# Patient Record
Sex: Male | Born: 1965 | Race: White | Hispanic: No | Marital: Married | Smoking: Never smoker
Health system: Southern US, Community
[De-identification: ages and names within clinical notes are randomized; demographics above are authoritative.]

## PROBLEM LIST (undated history)

## (undated) DIAGNOSIS — F419 Anxiety disorder, unspecified: Secondary | ICD-10-CM

## (undated) DIAGNOSIS — E785 Hyperlipidemia, unspecified: Secondary | ICD-10-CM

## (undated) DIAGNOSIS — F909 Attention-deficit hyperactivity disorder, unspecified type: Secondary | ICD-10-CM

## (undated) DIAGNOSIS — F329 Major depressive disorder, single episode, unspecified: Secondary | ICD-10-CM

## (undated) DIAGNOSIS — K219 Gastro-esophageal reflux disease without esophagitis: Secondary | ICD-10-CM

## (undated) DIAGNOSIS — F32A Depression, unspecified: Secondary | ICD-10-CM

## (undated) DIAGNOSIS — T7840XA Allergy, unspecified, initial encounter: Secondary | ICD-10-CM

## (undated) HISTORY — DX: Hyperlipidemia, unspecified: E78.5

## (undated) HISTORY — DX: Depression, unspecified: F32.A

## (undated) HISTORY — DX: Major depressive disorder, single episode, unspecified: F32.9

## (undated) HISTORY — DX: Attention-deficit hyperactivity disorder, unspecified type: F90.9

## (undated) HISTORY — DX: Gastro-esophageal reflux disease without esophagitis: K21.9

## (undated) HISTORY — PX: NASAL SINUS SURGERY: SHX719

## (undated) HISTORY — DX: Anxiety disorder, unspecified: F41.9

## (undated) HISTORY — DX: Allergy, unspecified, initial encounter: T78.40XA

---

## 1991-11-06 HISTORY — PX: CHOLESTEATOMA EXCISION: SHX1345

## 1997-11-05 HISTORY — PX: SPINE SURGERY: SHX786

## 2004-10-20 ENCOUNTER — Ambulatory Visit: Payer: Self-pay | Admitting: Family Medicine

## 2005-02-26 ENCOUNTER — Emergency Department (HOSPITAL_COMMUNITY): Admission: EM | Admit: 2005-02-26 | Discharge: 2005-02-26 | Payer: Self-pay | Admitting: Emergency Medicine

## 2005-07-24 ENCOUNTER — Ambulatory Visit (HOSPITAL_COMMUNITY): Payer: Self-pay | Admitting: Psychiatry

## 2005-07-31 ENCOUNTER — Ambulatory Visit: Payer: Self-pay | Admitting: Internal Medicine

## 2005-08-07 ENCOUNTER — Ambulatory Visit (HOSPITAL_COMMUNITY): Payer: Self-pay | Admitting: Psychiatry

## 2005-09-10 ENCOUNTER — Ambulatory Visit (HOSPITAL_COMMUNITY): Payer: Self-pay | Admitting: Psychiatry

## 2005-11-26 ENCOUNTER — Ambulatory Visit (HOSPITAL_COMMUNITY): Payer: Self-pay | Admitting: Psychiatry

## 2005-12-06 HISTORY — PX: VARICOCELE EXCISION: SUR582

## 2005-12-21 ENCOUNTER — Ambulatory Visit (HOSPITAL_BASED_OUTPATIENT_CLINIC_OR_DEPARTMENT_OTHER): Admission: RE | Admit: 2005-12-21 | Discharge: 2005-12-21 | Payer: Self-pay | Admitting: Urology

## 2006-01-18 ENCOUNTER — Emergency Department (HOSPITAL_COMMUNITY): Admission: EM | Admit: 2006-01-18 | Discharge: 2006-01-18 | Payer: Self-pay | Admitting: Emergency Medicine

## 2006-01-21 ENCOUNTER — Ambulatory Visit (HOSPITAL_COMMUNITY): Payer: Self-pay | Admitting: Psychiatry

## 2006-01-30 ENCOUNTER — Ambulatory Visit: Payer: Self-pay | Admitting: Internal Medicine

## 2006-02-27 ENCOUNTER — Ambulatory Visit (HOSPITAL_COMMUNITY): Payer: Self-pay | Admitting: Psychiatry

## 2006-04-29 ENCOUNTER — Ambulatory Visit (HOSPITAL_COMMUNITY): Payer: Self-pay | Admitting: Psychiatry

## 2006-06-03 ENCOUNTER — Ambulatory Visit (HOSPITAL_COMMUNITY): Payer: Self-pay | Admitting: Psychiatry

## 2006-07-01 ENCOUNTER — Ambulatory Visit (HOSPITAL_COMMUNITY): Payer: Self-pay | Admitting: Psychiatry

## 2006-09-24 ENCOUNTER — Ambulatory Visit (HOSPITAL_COMMUNITY): Payer: Self-pay | Admitting: Psychiatry

## 2006-09-30 ENCOUNTER — Ambulatory Visit: Payer: Self-pay | Admitting: Family Medicine

## 2007-02-26 ENCOUNTER — Ambulatory Visit (HOSPITAL_COMMUNITY): Payer: Self-pay | Admitting: Psychiatry

## 2007-03-17 ENCOUNTER — Ambulatory Visit (HOSPITAL_COMMUNITY): Payer: Self-pay | Admitting: Psychiatry

## 2007-05-12 ENCOUNTER — Ambulatory Visit: Payer: Self-pay | Admitting: Internal Medicine

## 2007-05-12 DIAGNOSIS — F411 Generalized anxiety disorder: Secondary | ICD-10-CM | POA: Insufficient documentation

## 2007-06-19 DIAGNOSIS — E785 Hyperlipidemia, unspecified: Secondary | ICD-10-CM

## 2007-07-03 ENCOUNTER — Ambulatory Visit: Payer: Self-pay | Admitting: Internal Medicine

## 2007-07-03 DIAGNOSIS — J309 Allergic rhinitis, unspecified: Secondary | ICD-10-CM | POA: Insufficient documentation

## 2007-08-07 ENCOUNTER — Ambulatory Visit: Payer: Self-pay | Admitting: Internal Medicine

## 2007-08-07 DIAGNOSIS — L919 Hypertrophic disorder of the skin, unspecified: Secondary | ICD-10-CM

## 2007-08-07 DIAGNOSIS — L909 Atrophic disorder of skin, unspecified: Secondary | ICD-10-CM | POA: Insufficient documentation

## 2007-08-11 LAB — CONVERTED CEMR LAB
ALT: 33 units/L (ref 0–53)
Cholesterol: 295 mg/dL (ref 0–200)
HDL: 33 mg/dL — ABNORMAL LOW (ref >39.0)
Triglycerides: 277 mg/dL (ref 0–149)
VLDL: 55 mg/dL — ABNORMAL HIGH (ref 0–40)

## 2007-09-11 ENCOUNTER — Ambulatory Visit: Payer: Self-pay | Admitting: Internal Medicine

## 2008-01-06 ENCOUNTER — Telehealth (INDEPENDENT_AMBULATORY_CARE_PROVIDER_SITE_OTHER): Payer: Self-pay | Admitting: *Deleted

## 2008-01-20 ENCOUNTER — Ambulatory Visit: Payer: Self-pay | Admitting: Internal Medicine

## 2008-02-23 ENCOUNTER — Ambulatory Visit: Payer: Self-pay | Admitting: Internal Medicine

## 2008-03-05 ENCOUNTER — Telehealth (INDEPENDENT_AMBULATORY_CARE_PROVIDER_SITE_OTHER): Payer: Self-pay | Admitting: *Deleted

## 2008-03-05 ENCOUNTER — Ambulatory Visit: Payer: Self-pay | Admitting: Internal Medicine

## 2008-07-15 ENCOUNTER — Telehealth: Payer: Self-pay | Admitting: Internal Medicine

## 2008-07-22 ENCOUNTER — Telehealth: Payer: Self-pay | Admitting: Internal Medicine

## 2008-09-13 ENCOUNTER — Ambulatory Visit: Payer: Self-pay | Admitting: Internal Medicine

## 2008-09-15 LAB — CONVERTED CEMR LAB
ALT: 33 units/L (ref 0–53)
AST: 23 units/L (ref 0–37)
BUN: 12 mg/dL (ref 6–23)
Bilirubin, Direct: 0.1 mg/dL (ref 0.0–0.3)
Calcium: 9.3 mg/dL (ref 8.4–10.5)
Creatinine, Ser: 1.1 mg/dL (ref 0.4–1.5)
Eosinophils Relative: 6.3 % — ABNORMAL HIGH (ref 0.0–5.0)
GFR calc Af Amer: 95 mL/min
Glucose, Bld: 97 mg/dL (ref 70–99)
HCT: 44.3 % (ref 39.0–52.0)
Hemoglobin: 15.4 g/dL (ref 13.0–17.0)
Lymphocytes Relative: 34.3 % (ref 12.0–46.0)
Monocytes Absolute: 0.4 10*3/uL (ref 0.1–1.0)
Monocytes Relative: 6 % (ref 3.0–12.0)
Phosphorus: 3.8 mg/dL (ref 2.3–4.6)
Platelets: 228 10*3/uL (ref 150–400)
Potassium: 4.6 meq/L (ref 3.5–5.1)
RDW: 13.9 % (ref 11.5–14.6)
Total Bilirubin: 0.8 mg/dL (ref 0.3–1.2)
Total CHOL/HDL Ratio: 6.3
Total Protein: 7.2 g/dL (ref 6.0–8.3)
Triglycerides: 78 mg/dL (ref 0–149)
VLDL: 16 mg/dL (ref 0–40)
WBC: 6.9 10*3/uL (ref 4.5–10.5)

## 2008-09-20 ENCOUNTER — Ambulatory Visit: Payer: Self-pay | Admitting: Internal Medicine

## 2008-09-21 ENCOUNTER — Encounter: Payer: Self-pay | Admitting: Internal Medicine

## 2008-12-23 ENCOUNTER — Telehealth: Payer: Self-pay | Admitting: Internal Medicine

## 2009-11-09 ENCOUNTER — Ambulatory Visit: Payer: Self-pay | Admitting: Family Medicine

## 2009-11-09 DIAGNOSIS — J069 Acute upper respiratory infection, unspecified: Secondary | ICD-10-CM | POA: Insufficient documentation

## 2009-11-24 ENCOUNTER — Emergency Department (HOSPITAL_COMMUNITY): Admission: EM | Admit: 2009-11-24 | Discharge: 2009-11-24 | Payer: Self-pay | Admitting: Gynecologic Oncology

## 2010-02-17 ENCOUNTER — Ambulatory Visit: Payer: Self-pay | Admitting: Internal Medicine

## 2010-03-21 ENCOUNTER — Ambulatory Visit: Payer: Self-pay | Admitting: Internal Medicine

## 2010-03-24 LAB — CONVERTED CEMR LAB
ALT: 50 units/L (ref 0–53)
AST: 27 units/L (ref 0–37)
Albumin: 4.9 g/dL (ref 3.5–5.2)
CO2: 30 meq/L (ref 19–32)
Creatinine, Ser: 1 mg/dL (ref 0.4–1.5)
Eosinophils Relative: 3.1 % (ref 0.0–5.0)
GFR calc non Af Amer: 82.93 mL/min (ref >60)
Glucose, Bld: 94 mg/dL (ref 70–99)
Lymphocytes Relative: 27.3 % (ref 12.0–46.0)
MCV: 85.2 fL (ref 78.0–100.0)
Monocytes Absolute: 0.4 10*3/uL (ref 0.1–1.0)
Neutrophils Relative %: 64 % (ref 43.0–77.0)
Phosphorus: 4.3 mg/dL (ref 2.3–4.6)
Platelets: 292 10*3/uL (ref 150.0–400.0)
RBC: 5.33 M/uL (ref 4.22–5.81)
Sodium: 142 meq/L (ref 135–145)
TSH: 2.28 microintl units/mL (ref 0.35–5.50)
Total Protein: 7.9 g/dL (ref 6.0–8.3)
WBC: 7.4 10*3/uL (ref 4.5–10.5)

## 2010-03-30 LAB — CONVERTED CEMR LAB
Cholesterol: 444 mg/dL — ABNORMAL HIGH (ref 0–200)
HDL: 45.6 mg/dL (ref >39.00)
Total CHOL/HDL Ratio: 10
Triglycerides: 420 mg/dL — ABNORMAL HIGH (ref 0.0–149.0)

## 2010-04-13 ENCOUNTER — Encounter: Payer: Self-pay | Admitting: Internal Medicine

## 2010-06-28 ENCOUNTER — Ambulatory Visit: Payer: Self-pay | Admitting: Internal Medicine

## 2010-07-01 LAB — CONVERTED CEMR LAB
Direct LDL: 323.3 mg/dL
HDL: 50.6 mg/dL (ref >39.00)
VLDL: 16.2 mg/dL (ref 0.0–40.0)

## 2010-08-29 ENCOUNTER — Ambulatory Visit: Payer: Self-pay | Admitting: Internal Medicine

## 2010-09-06 ENCOUNTER — Ambulatory Visit: Payer: Self-pay | Admitting: Internal Medicine

## 2010-09-08 LAB — CONVERTED CEMR LAB
AST: 28 units/L (ref 0–37)
Albumin: 4.2 g/dL (ref 3.5–5.2)
Alkaline Phosphatase: 75 units/L (ref 39–117)
Cholesterol: 316 mg/dL — ABNORMAL HIGH (ref 0–200)
Total Protein: 6.7 g/dL (ref 6.0–8.3)
Triglycerides: 180 mg/dL — ABNORMAL HIGH (ref 0.0–149.0)

## 2010-12-07 NOTE — Assessment & Plan Note (Signed)
Summary: CPX/CLE   Vital Signs:  Patient profile:   45 year old male Weight:      210 pounds Temp:     97.9 degrees F oral Pulse rate:   76 / minute Pulse rhythm:   regular BP sitting:   120 / 90  (left arm) Cuff size:   large  Vitals Entered By: Mervin Hack CMA Duncan Dull) (February 17, 2010 8:28 AM) CC: adult physical   History of Present Illness: concerned about his cholesterol checked by podiatrist last year when on lamisil and over 300 discussed his levels and Rx  Gets occ left arm tingling--more when sitting started  ~3 months ago goes into all fingers no weakness or trivial  Not seeing any psychiatrist Mood seems fine on lexapro STrattera didn't seem to help him Anxiety seems controlled No work problems  Allergies: 1)  ! Wellbutrin 2)  Fluoxetine Hcl (Fluoxetine Hcl)  Past History:  Past medical, surgical, family and social histories (including risk factors) reviewed for relevance to current acute and chronic problems.  Past Medical History: Reviewed history from 09/20/2008 and no changes required. Anxiety Hyperlipidemia Allergic rhinitis ADHD  Past Surgical History: Lumbar spine surgery (W-S) 1999 Cholesteatoma surgery on left  1993 Varicocele Earlene Plater) 02/07  Family History: Reviewed history from 06/19/2007 and no changes required. Father: Alive Mother:Alive, MI (upper 65's)  Siblings: 2 Brothers, both living, one has schizophrenia No HTN or DM No prostate or colon cancer No depression  Social History: Reviewed history from 06/19/2007 and no changes required. 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 Eyes:  Denies double vision and vision loss-1 eye. ENT:  Denies decreased hearing and ringing in ears; teeth okay--regular with dentist. CV:  Denies chest pain or  discomfort, difficulty breathing at night, difficulty breathing while lying down, fainting, lightheadness, palpitations, and shortness of breath with exertion. Resp:  Complains of cough; denies shortness of breath; relates cough to allergies. GI:  Complains of indigestion; denies abdominal pain, bloody stools, change in bowel habits, dark tarry stools, nausea, and vomiting; lots of reflux--fair control. GU:  Denies erectile dysfunction, urinary frequency, and urinary hesitancy. MS:  Denies joint pain and joint swelling. Derm:  Denies lesion(s) and rash. Neuro:  See HPI; Complains of numbness and tingling; denies headaches and weakness. Psych:  Complains of anxiety and depression; some depression--- will get several days then feel better. Heme:  Denies abnormal bruising and enlarge lymph nodes. Allergy:  Denies seasonal allergies and sneezing; on immunotherapy from Dr Corinda Gubler.  Physical Exam  General:  alert and normal appearance.   Eyes:  pupils equal, pupils round, pupils reactive to light, and no optic disk abnormalities.   Ears:  R ear normal and L ear normal.   Mouth:  no erythema and no lesions.   Neck:  supple, no masses, no thyromegaly, no carotid bruits, and no cervical lymphadenopathy.   Lungs:  normal respiratory effort and normal breath sounds.   Heart:  normal rate, regular rhythm, no murmur, and no gallop.   Abdomen:  soft, non-tender, and no masses.   Msk:  no joint tenderness and no joint swelling.   Pulses:  normal if feet Extremities:  no edema Neurologic:  alert & oriented X3, strength normal in all extremities, and gait normal.   Skin:  no rashes and no suspicious lesions.   Axillary Nodes:  No palpable lymphadenopathy Psych:  normally interactive, good eye contact, not anxious appearing, and not depressed appearing.     Impression & Recommendations:  Problem # 1:  HEALTH MAINTENANCE EXAM (ICD-V70.0) Assessment Comment Only discussed fitness Tdap  Problem # 2:   HYPERLIPIDEMIA (ICD-272.4) Assessment: Comment Only will change to pravastatin since safer in high dose can add fish oil if desired needs to work on lifestyle  The following medications were removed from the medication list:    Simvastatin 80 Mg Tabs (Simvastatin) .Marland Kitchen... 1 tablet daily at bedtime His updated medication list for this problem includes:    Pravastatin Sodium 80 Mg Tabs (Pravastatin sodium) .Marland Kitchen... 1 tab daily for high cholesterol  Problem # 3:  ANXIETY (ICD-300.00) Assessment: Unchanged reasonable control with just this med  His updated medication list for this problem includes:    Lexapro 10 Mg Tabs (Escitalopram oxalate) .Marland Kitchen... Take 1 by mouth once daily  Complete Medication List: 1)  Fexofenadine Hcl 180 Mg Tabs (Fexofenadine hcl) .... One daily 2)  Lexapro 10 Mg Tabs (Escitalopram oxalate) .... Take 1 by mouth once daily 3)  Omeprazole 40 Mg Cpdr (Omeprazole) .... Take 1 by mouth once daily 4)  Pravastatin Sodium 80 Mg Tabs (Pravastatin sodium) .Marland Kitchen.. 1 tab daily for high cholesterol  Other Orders: Tdap => 60yrs IM (78295) Admin 1st Vaccine (62130)  Patient Instructions: 1)  Please schedule a follow-up appointment in 1 year.  2)  Return in 4-6 weeks for fasting labs" 3)  lipid, TSH, hepatic--  272.4 4)  CBC with diff, renal-- 300.00 Prescriptions: PRAVASTATIN SODIUM 80 MG TABS (PRAVASTATIN SODIUM) 1 tab daily for high cholesterol  #90 x 3   Entered and Authorized by:   Cindee Salt MD   Signed by:   Cindee Salt MD on 02/17/2010   Method used:   Electronically to        [facility]* (mail-order)             ,          Ph: [phone]       Fax: [phone]   RxID:   [id number] LEXAPRO 10 MG TABS (ESCITALOPRAM OXALATE) take 1 by mouth once daily  #90 x 3   Entered and Authorized by:   Cindee Salt MD   Signed by:   Cindee Salt MD on 02/17/2010   Method used:   Electronically to        [facility]* (mail-order)              ,          Ph: [phone]       Fax: [phone]   RxID:   [id number]   Current Allergies (reviewed today): ! WELLBUTRIN FLUOXETINE HCL (FLUOXETINE HCL)   Immunizations Administered:  Tetanus Vaccine:    Vaccine Type: Tdap    Site: left deltoid    Mfr: GlaxoSmithKline    Dose: 0.5 ml    Route: IM    Given by: Mervin Hack CMA (AAMA)    Exp. Date: 01/28/2012    Lot #: [id number]    VIS given: 09/23/07 version given February 17, 2010.

## 2010-12-07 NOTE — Letter (Signed)
Summary: [facility]  [facility]   Imported By: Lanelle Bal 04/25/2010 12:59:51  _____________________________________________________________________  External Attachment:    Type:   Image     Comment:   External Document

## 2010-12-07 NOTE — Assessment & Plan Note (Signed)
Summary: ST,CONGESTION/CLE   Vital Signs:  Patient profile:   45 year old male Height:      70 inches Weight:      209 pounds BMI:     30.10 Temp:     97.8 degrees F oral Pulse rate:   76 / minute Pulse rhythm:   regular BP sitting:   122 / 76  (left arm) Cuff size:   large  Vitals Entered By: Lewanda Rife LPN (November 09, 2009 3:21 PM)  CC:  congestion in head and scratchy throat and dry hacky cough.  History of Present Illness: Here due to head congestion,  dry cough--onset x 3d --taking advil congestion-- --wife sick prior to his onset  Allergies: 1)  ! Wellbutrin 2)  Fluoxetine Hcl (Fluoxetine Hcl)  Review of Systems      See HPI  Physical Exam  General:  alert, well-developed, well-nourished, and well-hydrated.   Ears:  TMs retracted with increased fluid Nose:  no airflow obstruction, mucosal erythema, and mucosal edema.  sinuses +,- Mouth:  no exudates and pharyngeal erythema.   Lungs:  moist cough only, no crackles and no wheezes.   Neurologic:  alert & oriented X3 and gait normal.   Cervical Nodes:  no anterior cervical adenopathy and no posterior cervical adenopathy.   Psych:  normally interactive and subdued.     Impression & Recommendations:  Problem # 1:  URI (ICD-465.9) Assessment New continue comfort care measures: increase po fluids, rest, tylenol or IBP as needed will start on amoxicillin two times a day x10d see back if not improved His updated medication list for this problem includes:    Fexofenadine Hcl 180 Mg Tabs (Fexofenadine hcl) ..... One daily  Complete Medication List: 1)  Simvastatin 80 Mg Tabs (Simvastatin) .Marland Kitchen.. 1 tablet daily at bedtime 2)  Fexofenadine Hcl 180 Mg Tabs (Fexofenadine hcl) .... One daily 3)  Terbinafine Hcl 250 Mg Tabs (Terbinafine hcl) .... One daily 4)  Lamictal Odt 25 Mg Tbdp (Lamotrigine) .... Dissolve one on tongue daily 5)  Align Caps (Probiotic product) .... Otc as directed. 6)  Amoxicillin 500 Mg Caps  (Amoxicillin) .... Take 2 two times a day for uri Prescriptions: AMOXICILLIN 500 MG CAPS (AMOXICILLIN) take 2 two times a day for URI  #40 x 0   Entered and Authorized by:   Gildardo Griffes FNP   Signed by:   Gildardo Griffes FNP on 11/09/2009   Method used:   Electronically to        [facility] ([facility]ail)       [address]       [place], [place]  [address]       Ph: [phone]       Fax: [phone]   RxID:   [id number]   Current Allergies (reviewed today): ! WELLBUTRIN FLUOXETINE HCL (FLUOXETINE HCL)

## 2010-12-07 NOTE — Assessment & Plan Note (Signed)
Summary: COUGHING, CONGESTION, HEADACHE / LFW   Vital Signs:  Patient profile:   45 year old male Weight:      202 pounds BMI:     29.09 O2 Sat:      98 % on Room air Temp:     98.4 degrees F oral Pulse rate:   80 / minute Pulse rhythm:   regular BP sitting:   126 / 84  (left arm) Cuff size:   large  Vitals Entered By: Selena Batten Dance CMA Duncan Dull) (August 29, 2010 11:36 AM)  O2 Flow:  Room air CC: Cough/Congestion/Headache   History of Present Illness: CC: cold  1+ wk (about 9days) h/o nagging cold.  Dry coughing causing headache, congestion, tried alkaseltzer and has noticed good improvement in sxs.  Went to [place] over weekend, thinks [place] pollen worsened allergies.  + PNDrip  No purulent nasal discharge, no abd pain, n/v, myalgias, arthralgias.  No fevers/chills.  Improved RN.  no h/o sinus infx.  flu shot last friday. takes allergy shots, asks if can receive this week.  h/o allergies, reflux.  No smokers at home.  + wife sick previously, now better.  -  Date:  08/18/2010    Flu vaccine given per patient  Current Medications (verified): 1)  Omeprazole 40 Mg Cpdr (Omeprazole) .... Take 1 By Mouth Once Daily 2)  Crestor 40 Mg Tabs (Rosuvastatin Calcium) .... Take One By Mouth Daily 3)  Claritin 10 Mg Tabs (Loratadine) .Marland Kitchen.. 1 By Mouth Once Daily As Needed  Allergies: 1)  ! 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   Head:  Normocephalic and atraumatic without obvious abnormalities. No apparent alopecia or balding.  no sinus tenderness Eyes:  pupils equal, pupils round, pupils  reactive to light, and no optic disk abnormalities.   Ears:  R ear normal and L ear s/p cholesteatoma surgery Nose:  mild inflammation, midl discharge Mouth:  MMM.  + PN drip Neck:  supple, no masses, no thyromegaly, no carotid bruits, and no cervical lymphadenopathy.   Lungs:  normal respiratory effort and normal breath sounds.   Heart:  normal rate, regular rhythm, no murmur, and no gallop.   Pulses:  2+ rad pulse Extremities:  no edema Skin:  no rashes and no suspicious lesions.     Impression & Recommendations:  Problem # 1:  URI (ICD-465.9) exacerbated by allergic rhinitis.  recommended restart omnaris (has at home by allergist), nasal saline, cheratussin for cough at night.  given going on 9+ days, provided with WASP for zpack in case still ill by end of week.  advised I dont' think he will need to use it.  The following medications were removed from the medication list:    Fexofenadine Hcl 180 Mg Tabs (Fexofenadine hcl) ..... One daily His updated medication list for this problem includes:    Claritin 10 Mg Tabs (Loratadine) .Marland Kitchen... 1 by mouth once daily as needed    Cheratussin Ac 100-10 Mg/40ml Syrp (Guaifenesin-codeine) ..... One teaspoon every 4-6 hours as needed cough  Complete Medication List: 1)  Omeprazole 40 Mg Cpdr (Omeprazole) .... Take 1 by mouth once daily 2)  Crestor 40 Mg Tabs (Rosuvastatin calcium) .... Take one by mouth daily 3)  Claritin 10 Mg Tabs (Loratadine) .Marland Kitchen.. 1 by mouth once daily as needed 4)  Zithromax Z-pak 250 Mg Tabs (Azithromycin) .... Use as directed 5)  Cheratussin Ac 100-10 Mg/59ml Syrp (Guaifenesin-codeine) .... One teaspoon every 4-6 hours as needed cough  Patient Instructions: 1)  i think this is likely allergies/viral infection. 2)  restart omnaris and nasal saline 3)  cough syrup script today. 4)  Given going on for almost 10 days, provided with script to hold on to in case not improving as expected. 5)  Return if fever >101.5, or shortness  of breath worsening, or other concerns. 6)  Good to see you today, call clinic with quesitons. Prescriptions: CHERATUSSIN AC 100-10 MG/5ML SYRP (GUAIFENESIN-CODEINE) one teaspoon every 4-6 hours as needed cough  #100cc x 0   Entered and Authorized by:   Eustaquio Boyden  MD   Signed by:   Eustaquio Boyden  MD on 08/29/2010   Method used:   Print then Give to Patient   RxID:   [id number] ZITHROMAX Z-PAK 250 MG TABS (AZITHROMYCIN) use as directed  #1 x 0   Entered and Authorized by:   Eustaquio Boyden  MD   Signed by:   Eustaquio Boyden  MD on 08/29/2010   Method used:   Print then Give to Patient   RxID:   [id number]    Orders Added: 1)  Est. Patient Level III [84696]    Current Allergies (reviewed today): ! WELLBUTRIN FLUOXETINE HCL (FLUOXETINE HCL)

## 2010-12-27 ENCOUNTER — Ambulatory Visit (HOSPITAL_COMMUNITY): Admit: 2010-12-27 | Payer: Self-pay | Admitting: Psychiatry

## 2010-12-27 ENCOUNTER — Ambulatory Visit (HOSPITAL_COMMUNITY)
Admission: RE | Admit: 2010-12-27 | Discharge: 2010-12-27 | Disposition: A | Discharge status: Home / Self Care | Payer: PRIVATE HEALTH INSURANCE | Attending: Psychiatry | Admitting: Psychiatry

## 2010-12-27 DIAGNOSIS — F331 Major depressive disorder, recurrent, moderate: Secondary | ICD-10-CM | POA: Insufficient documentation

## 2011-01-01 ENCOUNTER — Other Ambulatory Visit (HOSPITAL_COMMUNITY): Payer: PRIVATE HEALTH INSURANCE | Attending: Psychiatry | Admitting: Psychiatry

## 2011-01-01 DIAGNOSIS — F39 Unspecified mood [affective] disorder: Secondary | ICD-10-CM

## 2011-01-01 DIAGNOSIS — F411 Generalized anxiety disorder: Secondary | ICD-10-CM

## 2011-01-02 ENCOUNTER — Other Ambulatory Visit (HOSPITAL_COMMUNITY): Payer: Self-pay | Admitting: Psychiatry

## 2011-01-02 ENCOUNTER — Other Ambulatory Visit (HOSPITAL_COMMUNITY): Payer: PRIVATE HEALTH INSURANCE | Attending: Psychiatry | Admitting: Psychiatry

## 2011-01-03 ENCOUNTER — Other Ambulatory Visit (HOSPITAL_COMMUNITY): Payer: PRIVATE HEALTH INSURANCE | Admitting: Psychiatry

## 2011-01-04 ENCOUNTER — Encounter: Payer: Self-pay | Admitting: Family Medicine

## 2011-01-04 ENCOUNTER — Ambulatory Visit (INDEPENDENT_AMBULATORY_CARE_PROVIDER_SITE_OTHER): Payer: Managed Care, Other (non HMO) | Admitting: Family Medicine

## 2011-01-04 ENCOUNTER — Other Ambulatory Visit (HOSPITAL_COMMUNITY): Payer: PRIVATE HEALTH INSURANCE | Attending: Psychiatry | Admitting: Psychiatry

## 2011-01-04 ENCOUNTER — Other Ambulatory Visit (HOSPITAL_COMMUNITY): Payer: PRIVATE HEALTH INSURANCE | Admitting: Psychiatry

## 2011-01-04 ENCOUNTER — Other Ambulatory Visit: Payer: Self-pay | Admitting: Family Medicine

## 2011-01-04 DIAGNOSIS — K219 Gastro-esophageal reflux disease without esophagitis: Secondary | ICD-10-CM | POA: Insufficient documentation

## 2011-01-04 DIAGNOSIS — E7800 Pure hypercholesterolemia, unspecified: Secondary | ICD-10-CM | POA: Insufficient documentation

## 2011-01-04 DIAGNOSIS — R5381 Other malaise: Secondary | ICD-10-CM

## 2011-01-04 DIAGNOSIS — R270 Ataxia, unspecified: Secondary | ICD-10-CM

## 2011-01-04 DIAGNOSIS — G119 Hereditary ataxia, unspecified: Secondary | ICD-10-CM

## 2011-01-04 DIAGNOSIS — F39 Unspecified mood [affective] disorder: Secondary | ICD-10-CM | POA: Insufficient documentation

## 2011-01-04 DIAGNOSIS — F411 Generalized anxiety disorder: Secondary | ICD-10-CM | POA: Insufficient documentation

## 2011-01-04 DIAGNOSIS — R51 Headache: Secondary | ICD-10-CM | POA: Insufficient documentation

## 2011-01-04 DIAGNOSIS — R519 Headache, unspecified: Secondary | ICD-10-CM | POA: Insufficient documentation

## 2011-01-04 DIAGNOSIS — R5383 Other fatigue: Secondary | ICD-10-CM

## 2011-01-05 ENCOUNTER — Other Ambulatory Visit (HOSPITAL_COMMUNITY): Payer: PRIVATE HEALTH INSURANCE | Admitting: Psychiatry

## 2011-01-05 ENCOUNTER — Encounter: Payer: Self-pay | Admitting: Family Medicine

## 2011-01-05 LAB — BASIC METABOLIC PANEL
BUN: 16 mg/dL (ref 6–23)
Calcium: 9.9 mg/dL (ref 8.4–10.5)
Creatinine, Ser: 1.1 mg/dL (ref 0.4–1.5)
GFR: 79.96 mL/min (ref >60.00)

## 2011-01-05 LAB — TSH: TSH: 2.18 u[IU]/mL (ref 0.35–5.50)

## 2011-01-06 ENCOUNTER — Ambulatory Visit
Admission: RE | Admit: 2011-01-06 | Discharge: 2011-01-06 | Disposition: A | Discharge status: Home / Self Care | Payer: PRIVATE HEALTH INSURANCE | Source: Ambulatory Visit | Attending: Family Medicine | Admitting: Family Medicine

## 2011-01-06 DIAGNOSIS — R270 Ataxia, unspecified: Secondary | ICD-10-CM

## 2011-01-06 DIAGNOSIS — R51 Headache: Secondary | ICD-10-CM

## 2011-01-06 MED ORDER — GADOBENATE DIMEGLUMINE 529 MG/ML IV SOLN
15.0000 mL | Freq: Once | INTRAVENOUS | Status: AC | PRN
  Administered 2011-01-06: 15 mL via INTRAVENOUS

## 2011-01-08 ENCOUNTER — Other Ambulatory Visit (HOSPITAL_COMMUNITY): Payer: PRIVATE HEALTH INSURANCE | Admitting: Psychiatry

## 2011-01-09 ENCOUNTER — Other Ambulatory Visit (HOSPITAL_COMMUNITY): Payer: PRIVATE HEALTH INSURANCE | Admitting: Psychiatry

## 2011-01-09 ENCOUNTER — Telehealth (INDEPENDENT_AMBULATORY_CARE_PROVIDER_SITE_OTHER): Payer: Self-pay | Admitting: *Deleted

## 2011-01-10 ENCOUNTER — Other Ambulatory Visit (HOSPITAL_COMMUNITY): Payer: PRIVATE HEALTH INSURANCE | Admitting: Psychiatry

## 2011-01-11 ENCOUNTER — Other Ambulatory Visit (HOSPITAL_COMMUNITY): Payer: PRIVATE HEALTH INSURANCE | Admitting: Psychiatry
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
## 2011-01-12 ENCOUNTER — Other Ambulatory Visit (HOSPITAL_COMMUNITY): Payer: PRIVATE HEALTH INSURANCE | Admitting: Psychiatry

## 2011-01-15 ENCOUNTER — Other Ambulatory Visit (HOSPITAL_COMMUNITY): Payer: PRIVATE HEALTH INSURANCE | Admitting: Psychiatry

## 2011-01-16 ENCOUNTER — Other Ambulatory Visit (HOSPITAL_COMMUNITY): Payer: PRIVATE HEALTH INSURANCE | Admitting: Psychiatry

## 2011-01-16 NOTE — Progress Notes (Signed)
       New/Updated Medications: AUGMENTIN 875-125 MG TABS (AMOXICILLIN-POT CLAVULANATE) take 1 tablet 2 times daily for 14 days Prescriptions: AUGMENTIN 875-125 MG TABS (AMOXICILLIN-POT CLAVULANATE) take 1 tablet 2 times daily for 14 days  #28 x 0   Entered by:   Benny Lennert CMA (AAMA)   Authorized by:   Kerby Nora MD   Signed by:   Benny Lennert CMA (AAMA) on 01/09/2011   Method used:   Electronically to        [facility]ail[facility]       [address]       [place], [place]  [address]       Ph: [phone]       Fax: [phone]   RxID:   [id number]

## 2011-01-17 ENCOUNTER — Other Ambulatory Visit (HOSPITAL_COMMUNITY): Payer: PRIVATE HEALTH INSURANCE | Admitting: Psychiatry

## 2011-01-18 ENCOUNTER — Other Ambulatory Visit (HOSPITAL_COMMUNITY): Payer: PRIVATE HEALTH INSURANCE | Admitting: Psychiatry

## 2011-01-18 LAB — CONVERTED CEMR LAB
Sex Hormone Binding: 16 nmol/L (ref 13–71)
Testosterone Free: 60.2 pg/mL (ref 47.0–244.0)

## 2011-01-19 ENCOUNTER — Other Ambulatory Visit (HOSPITAL_COMMUNITY): Payer: PRIVATE HEALTH INSURANCE | Admitting: Psychiatry

## 2011-01-19 ENCOUNTER — Encounter: Payer: Self-pay | Admitting: Internal Medicine

## 2011-01-19 ENCOUNTER — Ambulatory Visit (INDEPENDENT_AMBULATORY_CARE_PROVIDER_SITE_OTHER): Payer: PRIVATE HEALTH INSURANCE | Admitting: Internal Medicine

## 2011-01-19 DIAGNOSIS — R5383 Other fatigue: Secondary | ICD-10-CM

## 2011-01-19 DIAGNOSIS — F339 Major depressive disorder, recurrent, unspecified: Secondary | ICD-10-CM | POA: Insufficient documentation

## 2011-01-19 DIAGNOSIS — F411 Generalized anxiety disorder: Secondary | ICD-10-CM

## 2011-01-21 LAB — BASIC METABOLIC PANEL
BUN: 11 mg/dL (ref 6–23)
CO2: 26 mEq/L (ref 19–32)
Calcium: 9.1 mg/dL (ref 8.4–10.5)
Creatinine, Ser: 1.01 mg/dL (ref 0.4–1.5)
Glucose, Bld: 125 mg/dL — ABNORMAL HIGH (ref 70–99)

## 2011-01-21 LAB — DIFFERENTIAL
Basophils Absolute: 0 10*3/uL (ref 0.0–0.1)
Basophils Relative: 1 % (ref 0–1)
Monocytes Absolute: 0.4 10*3/uL (ref 0.1–1.0)
Neutro Abs: 4 10*3/uL (ref 1.7–7.7)
Neutrophils Relative %: 67 % (ref 43–77)

## 2011-01-21 LAB — CBC
Hemoglobin: 14.3 g/dL (ref 13.0–17.0)
MCHC: 34.9 g/dL (ref 30.0–36.0)
Platelets: 233 10*3/uL (ref 150–400)
RDW: 13.9 % (ref 11.5–15.5)

## 2011-01-21 LAB — POCT CARDIAC MARKERS
CKMB, poc: <1 ng/mL — ABNORMAL LOW (ref 1.0–8.0)
CKMB, poc: <1 ng/mL — ABNORMAL LOW (ref 1.0–8.0)

## 2011-01-22 ENCOUNTER — Other Ambulatory Visit (HOSPITAL_COMMUNITY): Payer: PRIVATE HEALTH INSURANCE | Admitting: Psychiatry

## 2011-01-23 NOTE — Assessment & Plan Note (Signed)
Summary: F/U LABWORK/CLE   [employer]   Vital Signs:  Patient profile:   45 year old male Height:      70 inches Weight:      213.50 pounds BMI:     30.74 Temp:     97.7 degrees F oral Pulse rate:   93 / minute Pulse rhythm:   regular BP sitting:   140 / 90  (left arm) Cuff size:   large  Vitals Entered By: Linde Gillis CMA Duncan Dull) (January 19, 2011 1:59 PM) CC: follow up after labs   History of Present Illness: Headache is better Concerned about the MRI findings so went to ENT Now on flonase and atrovent sprays  Depression really did hit him He feels he is doing better now Has been through program at mental health and back with psychiatrist--next appt 4/11 Has appt with counsellor now also  Still feels fatigued this has improved some  Feels on edge with his depression meds has discussed bipolar diagnosis in past--multiple different diagnoses Lamictal and abilify in the past---didn't like these (side effects) Adderall helped but he got more hyper  He still finds his concentration to be the main issue for him Also thinks his anxiety is primary diagnosis and calls the depression secondary  Allergies: 1)  ! Wellbutrin 2)  Fluoxetine Hcl (Fluoxetine Hcl)  Past History:  Past medical, surgical, family and social histories (including risk factors) reviewed for relevance to current acute and chronic problems.  Past Medical History: Anxiety Hyperlipidemia Allergic rhinitis ADHD GERD Depression  Past Surgical History: Reviewed history from 02/17/2010 and no changes required. Lumbar spine surgery (W-S) 1999 Cholesteatoma surgery on left  1993 Varicocele Earlene Plater) 02/07  Family History: Reviewed history from 06/19/2007 and no changes required. Father: Alive Mother:Alive, MI (upper 57's)  Siblings: 2 Brothers, both living, one has schizophrenia No HTN or DM No prostate or colon cancer No depression  Social History: Reviewed history from 06/19/2007 and no changes  required. 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   Psych:  normally interactive, good eye contact, dysphoric affect, and slightly anxious.     Impression & Recommendations:  Problem # 1:  FATIGUE (ICD-780.79) Assessment Improved some better discussed and don't feel he should be treated with testosterone  Problem # 2:  ANXIETY (ICD-300.00) Assessment: Improved mood slightly better now will follow up with psych emergency diazepam supply given for as needed use  The following medications were removed from the medication list:    Diazepam 5 Mg Tabs (Diazepam) .Marland Kitchen... 1 by mouth 1 hour before mri, may take additional 1/2 to 1 tab if needed His updated medication list for this problem includes:    Lexapro 10 Mg Tabs (Escitalopram oxalate) .Marland Kitchen... Take one tablet daily    Diazepam 5 Mg Tabs (Diazepam) .Marland Kitchen... 1/2-1 tab by mouth two times a day as needed for severe anxiety  Complete Medication List: 1)  Omeprazole 40 Mg Cpdr (Omeprazole) .... Take 1 by mouth once daily 2)  Crestor 40 Mg Tabs (Rosuvastatin calcium) .... Take one by mouth daily 3)  Claritin 10 Mg Tabs (Loratadine) .Marland Kitchen.. 1 by mouth once daily as needed 4)  Lexapro 10 Mg Tabs (Escitalopram oxalate) .... Take one tablet daily 5)  Augmentin 875-125 Mg Tabs (Amoxicillin-pot clavulanate) .... Take 1 tablet 2  times daily for 14 days 6)  Diazepam 5 Mg Tabs (Diazepam) .... 1/2-1 tab by mouth two times a day as needed for severe anxiety  Patient Instructions: 1)  Please schedule a follow-up appointment in 4 months for physical Prescriptions: DIAZEPAM 5 MG TABS (DIAZEPAM) 1/2-1 tab by mouth two times a day as needed for severe anxiety  #30 x 0   Entered and Authorized by:   Cindee Salt MD   Signed by:   Cindee Salt  MD on 01/19/2011   Method used:   Print then Give to Patient   RxID:   [id number]    Orders Added: 1)  Est. Patient Level III [08657]    Current Allergies (reviewed today): ! WELLBUTRIN FLUOXETINE HCL (FLUOXETINE HCL)

## 2011-02-01 NOTE — Letter (Signed)
Summary: [facility]   Imported By: Kassie Mends 01/22/2011 10:11:26  _____________________________________________________________________  External Attachment:    Type:   Image     Comment:   External Document

## 2011-02-05 ENCOUNTER — Ambulatory Visit: Payer: PRIVATE HEALTH INSURANCE | Admitting: Family Medicine

## 2011-03-23 NOTE — Op Note (Signed)
[name], [name]                ACCOUNT NO.:  [id number]   MEDICAL RECORD NO.:  [id number]          PATIENT TYPE:  AMB   LOCATION:  [facility]                         FACILITY:  [facility]   PHYSICIAN:  Ronald L. Earlene Plater, M.D.  DATE OF BIRTH:  14-Aug-1966   DATE OF PROCEDURE:  12/21/2005  DATE OF DISCHARGE:                                 OPERATIVE REPORT   PREOPERATIVE DIAGNOSIS:  Bilateral varicoceles.   POSTOPERATIVE DIAGNOSIS:  Bilateral varicoceles.   PROCEDURE:  Bilateral varicocelectomy.   SURGEON:  Lucrezia Starch. Earlene Plater, MD.   ASSISTANT:  Glade Nurse, MD.   ANESTHESIA:  General endotracheal.   SPECIMENS:  None.   BLOOD LOSS:  Minimal.   DRAINS:  None.   DESCRIPTION OF PROCEDURE:  The patient was identified by his wrist bracelet  and brought to room one where he received preprocedure antibiotics and was  administered general anesthesia. He was prepped and draped in the usual  sterile fashion taking care to minimize the risk of peripheral neuropathy or  compartment syndrome. Next, we palpated his bilateral external rings and we  marked off symmetrical 2 cm subinguinal incisions immediately inferior to  them. Next we addressed the left subinguinal area. We made an incision over  our previous marking, dissected down with bovie cautery. We bluntly  dissected down to the level of Scarpa's fascia which was divided sharply. We  then set our retractors and exposed the shelving ligament and identified the  cord. It was grasped by bluntly encircling it with the surgeon's first  finger and thinning the cremasters over the pubic tubercles then brought out  on the surgeon's finger. We double checked to make sure we had encircled the  entire cord. We next opened the cremasteric using sharp dissection, we  isolated the vas and vessels and these were dropped back down. These were  then isolated with the surgeon's first finger away from our dissection for  the remainder of the dissection on this  side. We then bluntly dissected  through the remainder of the cord and noted 3-4 large dilated internal  spermatic veins. These were isolated and ligated above and below with 4-0  silk suture. We did encounter a structure that appeared to be the artery.  This was spared and not ligated or transected. We then returned the cord  back into the subinguinal canal. We ensured excellent hemostasis, we then  closed Scarpa's with a 3-0 chromic in a figure-of-eight fashion and closed  the skin with running 4-0 subcuticular Vicryl. A dressing was applied. We  next turned our attention the contralateral side. The process was repeated,  we incised through our previous marking, dissected down with the Bovie  cautery to the level of Scarpa's fascia which was divided sharply, placed  our retractors, identified the shelving ligament. We identified the  ilioinguinal nerve which was kept clear from the dissection. We brought the  cord up. We isolated the vas and vessels bluntly and they were isolated from  our operative field. We next bluntly dissected through the remainder of the  cord, encountered 3-4 dilated internal spermatic veins  which were doubly  ligated with 4-0 silk sutures. We next returned the cord to the inguinal  canal. We ensured there was excellent hemostasis by meticulous inspection of  the operative field. We next closed Scarpa's fascia with a figure-of-eight 3-  0 chromic and the skin was closed with a running subcuticular 4-0 Vicryl.  Dressings were applied. The patient was reversed from his anesthesia which  he tolerated without complication. Please note Dr. Darvin Neighbours was present and  participated in all aspects of this case.     ______________________________  Glade Nurse, MD      Lucrezia Starch. Earlene Plater, M.D.  Electronically Signed    MT/MEDQ  D:  12/21/2005  T:  12/21/2005  Job:  [id number]

## 2011-07-13 ENCOUNTER — Telehealth: Payer: Self-pay | Admitting: Family Medicine

## 2011-07-13 NOTE — Telephone Encounter (Signed)
Patient asked to switch from [facility] to you.  Patient's wife,[name],is a patient of yours and she really likes you. Please advise.

## 2011-07-16 NOTE — Telephone Encounter (Signed)
I left a message on pt's voicemail to let him know he can switch to Dr. Dayton Martes and call back and schedule appointment with Dr.Aron.

## 2011-07-16 NOTE — Telephone Encounter (Signed)
That's fine

## 2011-07-27 ENCOUNTER — Encounter: Payer: Self-pay | Admitting: Family Medicine

## 2011-07-30 ENCOUNTER — Ambulatory Visit: Payer: PRIVATE HEALTH INSURANCE | Admitting: Family Medicine

## 2011-08-09 ENCOUNTER — Ambulatory Visit (INDEPENDENT_AMBULATORY_CARE_PROVIDER_SITE_OTHER): Payer: PRIVATE HEALTH INSURANCE | Admitting: Family Medicine

## 2011-08-09 ENCOUNTER — Encounter: Payer: Self-pay | Admitting: Family Medicine

## 2011-08-09 VITALS — BP 120/82 | HR 67 | Temp 98.4°F | Ht 70.0 in | Wt 195.5 lb

## 2011-08-09 DIAGNOSIS — E785 Hyperlipidemia, unspecified: Secondary | ICD-10-CM

## 2011-08-09 MED ORDER — ROSUVASTATIN CALCIUM 40 MG PO TABS: 40.0000 mg | ORAL_TABLET | Freq: Every day | ORAL | Status: DC

## 2011-08-09 NOTE — Progress Notes (Signed)
  Subjective:    Patient ID: [name], male    DOB: [birth year], 45 y.o.   MRN: [id number]  HPI  45 yo male new to me here to "check cholesterol." Currently taking Crestor 40 mg daily with no noticeable side effects. H/o extremely elevated LDL in past. Lab Results  Component Value Date   CHOL 316* 09/06/2010   CHOL 404* 06/30/2010   CHOL 444* 03/21/2010   Lab Results  Component Value Date   HDL 44.70 09/06/2010   HDL 40.98 06/30/2010   HDL 11.91 03/21/2010    Lab Results  Component Value Date   TRIG 180.0* 09/06/2010   TRIG 81.0 06/30/2010   TRIG 420.0* 03/21/2010   Lab Results  Component Value Date   CHOLHDL 7 09/06/2010   CHOLHDL 8 06/30/2010   CHOLHDL 10 03/21/2010   Lab Results  Component Value Date   LDLDIRECT 224.2 09/06/2010   LDLDIRECT 323.3 06/30/2010   LDLDIRECT 302.8 03/21/2010    Review of Systems See HPI    Objective:   Physical Exam BP 120/82  Pulse 67  Temp(Src) 98.4 F (36.9 C) (Oral)  Ht 5\' 10"  (1.778 m)  Wt 195 lb 8 oz (88.678 kg)  BMI 28.05 kg/m2 General:  Slightly anxious male in NAD Mouth:  Oral mucosa and oropharynx without lesions or exudates.  Teeth in good repair. Neck:  no carotid bruit or thyromegaly no cervical or supraclavicular lymphadenopathy  Lungs:  Normal respiratory effort, chest expands symmetrically. Lungs are clear to auscultation, no crackles or wheezes. Heart:  Normal rate and regular rhythm. S1 and S2 normal without gallop, murmur, click, rub or other extra sounds. Pulses:  R and L posterior tibial pulses are full and equal bilaterally  Extremities:  no edema       Assessment & Plan:   1. HYPERLIPIDEMIA  Lipid Profile, Hepatic function panel   Not at goal when last checked. Recheck labs today, continue current dose of Crestor. The patient indicates understanding of these issues and agrees with the plan.

## 2011-08-09 NOTE — Patient Instructions (Signed)
Nice to meet you. We will call you with your lab results.   

## 2011-08-10 ENCOUNTER — Encounter: Payer: Self-pay | Admitting: *Deleted

## 2011-08-10 LAB — LDL CHOLESTEROL, DIRECT: Direct LDL: 188.5 mg/dL

## 2011-08-10 LAB — HEPATIC FUNCTION PANEL
ALT: 31 U/L (ref 0–53)
AST: 24 U/L (ref 0–37)
Bilirubin, Direct: 0.1 mg/dL (ref 0.0–0.3)
Total Bilirubin: 1 mg/dL (ref 0.3–1.2)

## 2011-08-10 LAB — LIPID PANEL: Triglycerides: 99 mg/dL (ref 0.0–149.0)

## 2012-07-14 ENCOUNTER — Other Ambulatory Visit: Payer: Self-pay | Admitting: Family Medicine

## 2012-07-14 DIAGNOSIS — E785 Hyperlipidemia, unspecified: Secondary | ICD-10-CM

## 2012-07-14 DIAGNOSIS — Z0000 Encounter for general adult medical examination without abnormal findings: Secondary | ICD-10-CM | POA: Insufficient documentation

## 2012-07-17 ENCOUNTER — Other Ambulatory Visit (INDEPENDENT_AMBULATORY_CARE_PROVIDER_SITE_OTHER): Payer: PRIVATE HEALTH INSURANCE

## 2012-07-17 DIAGNOSIS — Z0000 Encounter for general adult medical examination without abnormal findings: Secondary | ICD-10-CM

## 2012-07-17 DIAGNOSIS — E785 Hyperlipidemia, unspecified: Secondary | ICD-10-CM

## 2012-07-18 LAB — COMPREHENSIVE METABOLIC PANEL
BUN: 20 mg/dL (ref 6–23)
CO2: 30 mEq/L (ref 19–32)
Creatinine, Ser: 1 mg/dL (ref 0.4–1.5)
GFR: 82.05 mL/min (ref >60.00)
Glucose, Bld: 88 mg/dL (ref 70–99)
Total Bilirubin: 0.7 mg/dL (ref 0.3–1.2)

## 2012-07-18 LAB — LIPID PANEL
Cholesterol: 299 mg/dL — ABNORMAL HIGH (ref 0–200)
HDL: 40.3 mg/dL (ref >39.00)
VLDL: 31 mg/dL (ref 0.0–40.0)

## 2012-07-22 ENCOUNTER — Ambulatory Visit (INDEPENDENT_AMBULATORY_CARE_PROVIDER_SITE_OTHER): Payer: PRIVATE HEALTH INSURANCE | Admitting: Family Medicine

## 2012-07-22 ENCOUNTER — Encounter: Payer: Self-pay | Admitting: Family Medicine

## 2012-07-22 VITALS — BP 122/76 | HR 76 | Temp 97.9°F | Ht 70.0 in | Wt 218.0 lb

## 2012-07-22 DIAGNOSIS — F411 Generalized anxiety disorder: Secondary | ICD-10-CM

## 2012-07-22 DIAGNOSIS — Z23 Encounter for immunization: Secondary | ICD-10-CM

## 2012-07-22 DIAGNOSIS — Z0000 Encounter for general adult medical examination without abnormal findings: Secondary | ICD-10-CM

## 2012-07-22 DIAGNOSIS — E785 Hyperlipidemia, unspecified: Secondary | ICD-10-CM

## 2012-07-22 DIAGNOSIS — Z8249 Family history of ischemic heart disease and other diseases of the circulatory system: Secondary | ICD-10-CM

## 2012-07-22 MED ORDER — EZETIMIBE-SIMVASTATIN 10-80 MG PO TABS: 1.0000 | ORAL_TABLET | Freq: Every day | ORAL | Status: DC

## 2012-07-22 NOTE — Addendum Note (Signed)
Addended by: Eliezer Bottom on: 07/22/2012 08:53 AM   Modules accepted: Orders

## 2012-07-22 NOTE — Progress Notes (Signed)
Subjective:    Patient ID: [name], male    DOB: 01-22-1966, 46 y.o.   MRN: [id number]  HPI  46 yo male with h/o extremely elevated LDL here for CPX.  HLD- Currently taking Crestor 40 mg daily with no noticeable side effects. Cholesterol remains poorly controlled and has deteriorated since last year.  Has been on multiple statins, LDL not improving.  HDL and TG are ok.  Lab Results  Component Value Date   CHOL 299* 07/17/2012   HDL 40.30 07/17/2012   LDLDIRECT 223.4 07/17/2012   TRIG 155.0* 07/17/2012   CHOLHDL 7 07/17/2012   Mom had MI in 3s.  Pt denies any CP, SOB or DOE.   Patient Active Problem List  Diagnosis  . HYPERLIPIDEMIA  . ANXIETY  . URI  . ALLERGIC RHINITIS  . SKIN TAG  . ATAXIA, CEREBELLAR  . FATIGUE  . HEADACHE  . DEPRESSION  . Routine general medical examination at a health care facility   Past Medical History  Diagnosis Date  . Anxiety   . Hyperlipidemia   . Allergy   . GERD (gastroesophageal reflux disease)   . Depression   . ADHD (attention deficit hyperactivity disorder)    Past Surgical History  Procedure Date  . Spine surgery 1999    Lumbar   . Cholesteatoma excision 1993    left  . Varicocele excision 12/2005   History  Substance Use Topics  . Smoking status: Never Smoker   . Smokeless tobacco: Not on file  . Alcohol Use: Yes   No family history on file. Allergies  Allergen Reactions  . Bupropion Hcl     REACTION: anxiety  . Fluoxetine Hcl     REACTION: more anxious and tired   Current Outpatient Prescriptions on File Prior to Visit  Medication Sig Dispense Refill  . clonazePAM (KLONOPIN) 0.5 MG tablet Take 1/2 to 1 tablet by mouth two times  A day as needed for severe anxiety       . diazepam (VALIUM) 5 MG tablet Take 1/2 to 1 tablet by mouth two times a day as needed for severe anxiety       . escitalopram (LEXAPRO) 10 MG tablet Take 10 mg by mouth daily.        Marland Kitchen FLUoxetine (PROZAC) 10 MG capsule Take 10 mg by  mouth daily.        Marland Kitchen loratadine (CLARITIN) 10 MG tablet Take 10 mg by mouth daily.        Marland Kitchen omeprazole (PRILOSEC) 40 MG capsule Take 40 mg by mouth daily.        . rosuvastatin (CRESTOR) 40 MG tablet Take 1 tablet (40 mg total) by mouth daily.  90 tablet  3   The PMH, PSH, Social History, Family History, Medications, and allergies have been reviewed in [facility], and have been updated if relevant.    Review of Systems See HPI Patient reports no  vision/ hearing changes,anorexia, weight change, fever ,adenopathy, persistant / recurrent hoarseness, swallowing issues, chest pain, edema,persistant / recurrent cough, hemoptysis, dyspnea(rest, exertional, paroxysmal nocturnal), gastrointestinal  bleeding (melena, rectal bleeding), abdominal pain, excessive heart burn, GU symptoms(dysuria, hematuria, pyuria, voiding/incontinence  Issues) syncope, focal weakness, severe memory loss, concerning skin lesions, depression, anxiety, abnormal bruising/bleeding, major joint swelling.      Objective:   Physical Exam BP 122/76  Pulse 76  Temp 97.9 F (36.6 C)  Ht 5\' 10"  (1.778 m)  Wt 218 lb (98.884 kg)  BMI 31.28 kg/m2  General:  overweght male in NAD Eyes:  PERRL Ears:  External ear exam shows no significant lesions or deformities.  Otoscopic examination reveals clear canals, tympanic membranes are intact bilaterally without bulging, retraction, inflammation or discharge. Hearing is grossly normal bilaterally. Nose:  External nasal examination shows no deformity or inflammation. Nasal mucosa are pink and moist without lesions or exudates. Mouth:  Oral mucosa and oropharynx without lesions or exudates.  Teeth in good repair. Neck:  no carotid bruit or thyromegaly no cervical or supraclavicular lymphadenopathy  Lungs:  Normal respiratory effort, chest expands symmetrically. Lungs are clear to auscultation, no crackles or wheezes. Heart:  Normal rate and regular rhythm. S1 and S2 normal without gallop, murmur,  click, rub or other extra sounds. Abdomen:  Bowel sounds positive,abdomen soft and non-tender without masses, organomegaly or hernias noted. Pulses:  R and L posterior tibial pulses are full and equal bilaterally  Extremities:  no edema      Assessment & Plan:   1. Routine general medical examination at a health care facility  Reviewed preventive care protocols, scheduled due services, and updated immunizations Discussed nutrition, exercise, diet, and healthy lifestyle.   2. HYPERLIPIDEMIA  Deteriorated. Given FH of CAD and failed medical management, will refer to cardiology for treatment. D/c crestor, start Vytorin. The patient indicates understanding of these issues and agrees with the plan.   3. ANXIETY  Stable.

## 2012-07-22 NOTE — Patient Instructions (Addendum)
After you finish your crestor, please don't get more refills and pick up your vytorin. Please stop by to see Shirlee Limerick on your way out to set up your cardiology referral.

## 2012-08-25 ENCOUNTER — Encounter: Payer: Self-pay | Admitting: Cardiovascular Disease

## 2012-08-25 ENCOUNTER — Ambulatory Visit (INDEPENDENT_AMBULATORY_CARE_PROVIDER_SITE_OTHER): Payer: PRIVATE HEALTH INSURANCE | Admitting: Cardiovascular Disease

## 2012-08-25 VITALS — BP 140/82 | HR 95 | Ht 70.0 in | Wt 213.5 lb

## 2012-08-25 VITALS — BP 160/100 | HR 95 | Ht 70.0 in | Wt 213.5 lb

## 2012-08-25 DIAGNOSIS — R0600 Dyspnea, unspecified: Secondary | ICD-10-CM

## 2012-08-25 DIAGNOSIS — Z8249 Family history of ischemic heart disease and other diseases of the circulatory system: Secondary | ICD-10-CM

## 2012-08-25 DIAGNOSIS — R5381 Other malaise: Secondary | ICD-10-CM

## 2012-08-25 DIAGNOSIS — R0609 Other forms of dyspnea: Secondary | ICD-10-CM

## 2012-08-25 DIAGNOSIS — R5383 Other fatigue: Secondary | ICD-10-CM

## 2012-08-25 DIAGNOSIS — E785 Hyperlipidemia, unspecified: Secondary | ICD-10-CM

## 2012-08-25 MED ORDER — EZETIMIBE 10 MG PO TABS: 10.0000 mg | ORAL_TABLET | Freq: Every day | ORAL | Status: DC

## 2012-08-25 NOTE — Progress Notes (Signed)
HPI  [name] is a 46 year old male who was referred by Dr. Dayton Martes for evaluation of severe hyperlipidemia as well as fatigue. The patient has family history of severe hyperlipidemia. He also has history of premature coronary artery disease. His mother had myocardial infarction in her early 31s. He has no previous cardiac history. He has no history of hypertension or diabetes. He remembers having an LDL greater than 200 for many years. He works third shift at the [employer]. He does not follow a low cholesterol diet. Actually, on most mornings he stopps at a fast food restaurant to eat eggs and sausage. He has been on Crestor 40 mg daily but there was slight worsening in his hyperlipidemia recently. It was suggested to switch him to Vytorin. The patient denies any chest pain, palpitations or dizziness. He complains of exertional dyspnea and fatigue. He does not exercise on a regular basis.  Allergies  Allergen Reactions  . Bupropion Hcl     REACTION: anxiety  . Fluoxetine Hcl     REACTION: more anxious and tired     Current Outpatient Prescriptions on File Prior to Visit  Medication Sig Dispense Refill  . ALPRAZolam (XANAX) 0.25 MG tablet 0.25 mg at bedtime as needed.      Marland Kitchen FLUoxetine (PROZAC) 10 MG capsule Take 10 mg by mouth daily.        . methylphenidate (RITALIN) 20 MG tablet Take 20 mg by mouth daily.      Marland Kitchen omeprazole (PRILOSEC) 40 MG capsule Take 40 mg by mouth daily.        Marland Kitchen ezetimibe (ZETIA) 10 MG tablet Take 1 tablet (10 mg total) by mouth daily.  15 tablet  0     Past Medical History  Diagnosis Date  . Anxiety   . Hyperlipidemia   . Allergy   . GERD (gastroesophageal reflux disease)   . Depression   . ADHD (attention deficit hyperactivity disorder)      Past Surgical History  Procedure Date  . Spine surgery 1999    Lumbar   . Cholesteatoma excision 1993    left  . Varicocele excision 12/2005     Family History  Problem Relation Age of Onset  .  Hyperlipidemia Mother   . Heart attack Mother 12  . Hypertension Brother      History   Social History  . Marital Status: Married    Spouse Name: N/A    Number of Children: N/A  . Years of Education: N/A   Occupational History  . 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 Smoking status: Never Smoker   . Smokeless tobacco: Not on file  . Alcohol Use: No  . Drug Use: No  . Sexually Active:    Other Topics Concern  . Not on file   Social History Narrative  . No narrative on file     ROS Constitutional: Negative for fever, chills, diaphoresis, activity change, appetite change.  HENT: Negative for hearing loss, nosebleeds, congestion, sore throat, facial swelling, drooling, trouble swallowing, neck pain, voice change, sinus pressure and tinnitus.  Eyes: Negative for photophobia, pain, discharge and visual disturbance.  Respiratory: Negative for apnea, cough, chest tightness and wheezing.  Cardiovascular: Negative for chest pain, palpitations and leg swelling.  Gastrointestinal: Negative for nausea, vomiting, abdominal pain, diarrhea, constipation, blood in stool and abdominal distention.  Genitourinary: Negative for dysuria, urgency, frequency, hematuria and decreased urine volume.  Musculoskeletal: Negative for myalgias,  back pain, joint swelling, arthralgias and gait problem.  Skin: Negative for color change, pallor, rash and wound.  Neurological: Negative for dizziness, tremors, seizures, syncope, speech difficulty, weakness, light-headedness, numbness and headaches.  Psychiatric/Behavioral: Negative for suicidal ideas, hallucinations, behavioral problems and agitation. The patient is not nervous/anxious.     PHYSICAL EXAM   BP 160/100  Pulse 95  Ht 5\' 10"  (1.778 m)  Wt 213 lb 8 oz (96.843 kg)  BMI 30.63 kg/m2 Constitutional: He is oriented to person, place, and time. He appears well-developed and well-nourished. No distress.  HENT: No nasal  discharge.  Head: Normocephalic and atraumatic.  Eyes: Pupils are equal and round. Right eye exhibits no discharge. Left eye exhibits no discharge.  Neck: Normal range of motion. Neck supple. No JVD present. No thyromegaly present.  Cardiovascular: Normal rate, regular rhythm, normal heart sounds and. Exam reveals no gallop and no friction rub. No murmur heard.  Pulmonary/Chest: Effort normal and breath sounds normal. No stridor. No respiratory distress. He has no wheezes. He has no rales. He exhibits no tenderness.  Abdominal: Soft. Bowel sounds are normal. He exhibits no distension. There is no tenderness. There is no rebound and no guarding.  Musculoskeletal: Normal range of motion. He exhibits no edema and no tenderness.  Neurological: He is alert and oriented to person, place, and time. Coordination normal.  Skin: Skin is warm and dry. No rash noted. He is not diaphoretic. No erythema. No pallor.  Psychiatric: He has a normal mood and affect. His behavior is normal. Judgment and thought content normal.       ZOX:WRUEA  Rhythm  -Right axis -  ABNORMAL    ASSESSMENT AND PLAN

## 2012-08-25 NOTE — Patient Instructions (Addendum)
Normal stress test

## 2012-08-25 NOTE — Assessment & Plan Note (Signed)
The patient has nonspecific complaints of fatigue and occasional exertional dyspnea. There is likely a component of physical deconditioning. His ECG does not show any ischemic changes. Due to his prolonged history of hyperlipidemia and family history of premature CAD, I recommend a treadmill stress test for evaluation.

## 2012-08-25 NOTE — Procedures (Signed)
    Treadmill Stress test  Indication: Fatigue and dyspnea.  Baseline Data:  Resting EKG shows NSR with rate of 103 bpm, no significant ST or T wave changes. Resting blood pressure of 140/82 mm Hg Stand bruce protocal was used.  Exercise Data:  Patient exercised for 9 min 0 sec,  Peak heart rate of 151 bpm.  This was 86 % of the maximum predicted heart rate. No symptoms of chest pain or lightheadedness were reported at peak stress or in recovery.  Peak Blood pressure recorded was 180/94 Maximal work level: 10.1 METs.  Heart rate at 3 minutes in recovery was 106 bpm. BP response: Normal HR response: Normal  EKG with Exercise: Sinus tachycardia with no significant ST changes.  FINAL IMPRESSION: Normal exercise stress test. No significant EKG changes concerning for ischemia. Good exercise tolerance.

## 2012-08-25 NOTE — Patient Instructions (Addendum)
Continue Crestor 40 mg daily.  Start Zetia 10 mg once daily.   Check labs in 1 month (you need to be food fasting for 6 hours)  Your physician has requested that you have an exercise tolerance test. For further information please visit [email or web address] Please also follow instruction sheet, as given.  Start an exercise program.  Cut down on fatty food.   Follow up in 2 months.

## 2012-08-25 NOTE — Assessment & Plan Note (Signed)
Lab Results  Component Value Date   CHOL 299* 07/17/2012   HDL 40.30 07/17/2012   LDLDIRECT 223.4 07/17/2012   TRIG 155.0* 07/17/2012   CHOLHDL 7 07/17/2012   The patient has severe hyperlipidemia. He needs a combination therapy given that he is on maximum dose Crestor. I don't think switching him to Vytorin would achieve that. He needs to be on the most potent statin which is currently Crestor 40 mg daily. In addition, he will likely benefit from adding Zetia to Crestor. I explained to him that there has been no strong research supporting the role of Zetia in decreasing cardiovascular events. However, his LDL is significantly elevated that additional of lowering is likely warranted.  So for now, he will stay on Crestor 40 mg daily and will add Zetia 10 mg daily. I also had a prolonged discussion with him about the importance of low fat/low carbohydrate diet. We also discussed today starting an exercise program. I will recheck fasting lipid and liver profile in 4 weeks from now.

## 2012-09-25 ENCOUNTER — Ambulatory Visit (INDEPENDENT_AMBULATORY_CARE_PROVIDER_SITE_OTHER): Payer: PRIVATE HEALTH INSURANCE

## 2012-09-25 ENCOUNTER — Other Ambulatory Visit: Payer: Self-pay | Admitting: Cardiovascular Disease

## 2012-09-25 DIAGNOSIS — E785 Hyperlipidemia, unspecified: Secondary | ICD-10-CM

## 2012-09-25 MED ORDER — ROSUVASTATIN CALCIUM 40 MG PO TABS: 40.0000 mg | ORAL_TABLET | Freq: Every day | ORAL | Status: DC

## 2012-09-25 NOTE — Telephone Encounter (Signed)
Refilled Crestor. 

## 2012-09-26 LAB — LIPID PANEL
LDL Calculated: 164 mg/dL — ABNORMAL HIGH (ref 0–99)
VLDL Cholesterol Cal: 39 mg/dL (ref 5–40)

## 2012-09-26 LAB — HEPATIC FUNCTION PANEL
Albumin: 4.8 g/dL (ref 3.5–5.5)
Bilirubin, Direct: 0.16 mg/dL (ref 0.00–0.40)
Total Bilirubin: 0.8 mg/dL (ref 0.0–1.2)
Total Protein: 7.2 g/dL (ref 6.0–8.5)

## 2012-10-27 ENCOUNTER — Telehealth: Payer: Self-pay | Admitting: *Deleted

## 2012-10-27 MED ORDER — ROSUVASTATIN CALCIUM 40 MG PO TABS: 40.0000 mg | ORAL_TABLET | Freq: Every day | ORAL | Status: DC

## 2012-10-27 NOTE — Telephone Encounter (Signed)
Pt calling for 90 day refill on Crestor. Refill sent in. Pt has appt with Dr. Kirke Corin on 11/06/12 Mylo Red RN

## 2012-11-05 HISTORY — PX: NASAL SINUS SURGERY: SHX719

## 2012-11-06 ENCOUNTER — Encounter: Payer: Self-pay | Admitting: Cardiovascular Disease

## 2012-11-06 ENCOUNTER — Ambulatory Visit (INDEPENDENT_AMBULATORY_CARE_PROVIDER_SITE_OTHER): Payer: PRIVATE HEALTH INSURANCE | Admitting: Cardiovascular Disease

## 2012-11-06 VITALS — BP 136/80 | HR 100 | Ht 70.0 in | Wt 219.8 lb

## 2012-11-06 DIAGNOSIS — R5381 Other malaise: Secondary | ICD-10-CM

## 2012-11-06 DIAGNOSIS — E785 Hyperlipidemia, unspecified: Secondary | ICD-10-CM

## 2012-11-06 NOTE — Patient Instructions (Addendum)
Continue same medications  Follow up in 1 year

## 2012-11-06 NOTE — Assessment & Plan Note (Signed)
Negative recent treadmill stress test. I advised him to start a regular exercise program to improve overall conditioning and also to improve his hyperlipidemia.

## 2012-11-06 NOTE — Assessment & Plan Note (Signed)
Most recent lipid profile showed improvement. His LDL decreased from 223 to 119. Triglyceride was 190. He is to continue Crestor 40 mg once daily and Zetia 10 mg once daily in addition to 3 g of fish oil daily. I also discussed with him the importance of improving his diet habits and starting a regular exercise program.

## 2012-11-06 NOTE — Progress Notes (Signed)
HPI  [name] is a 47 year old male who is here today for a followup visit regarding  severe hyperlipidemia as well as fatigue. The patient has family history of severe hyperlipidemia. He also has history of premature coronary artery disease. His mother had myocardial infarction in her early 64s. He has no previous cardiac history. He has no history of hypertension or diabetes. He remembers having an LDL greater than 200 for many years. He works third shift at the post office. He does not follow a low cholesterol diet. Actually, on most mornings he stopps at a fast food restaurant to eat eggs and sausage. He has been on Crestor 40 mg daily but there was slight worsening in his hyperlipidemia recently. The patient denies any chest pain, palpitations or dizziness. He complains of exertional dyspnea and fatigue. He does not exercise on a regular basis. During last visit, he underwent a treadmill stress test which showed no evidence of ischemia. I added Zetia to Crestor 40 mg daily. Followup lipid profile showed improvement. He is tolerating the combination.  Allergies  Allergen Reactions  . Bupropion Hcl     REACTION: anxiety  . Fluoxetine Hcl     REACTION: more anxious and tired     Current Outpatient Prescriptions on File Prior to Visit  Medication Sig Dispense Refill  . ALPRAZolam (XANAX) 0.25 MG tablet 0.25 mg at bedtime as needed.      . ezetimibe (ZETIA) 10 MG tablet Take 1 tablet (10 mg total) by mouth daily.  15 tablet  0  . FLUoxetine (PROZAC) 10 MG capsule Take 10 mg by mouth daily.        . methylphenidate (RITALIN) 20 MG tablet Take 20 mg by mouth daily.      Marland Kitchen omeprazole (PRILOSEC) 40 MG capsule Take 40 mg by mouth daily.        . rosuvastatin (CRESTOR) 40 MG tablet Take 1 tablet (40 mg total) by mouth daily.  90 tablet  3     Past Medical History  Diagnosis Date  . Anxiety   . Hyperlipidemia   . Allergy   . GERD (gastroesophageal reflux disease)   . Depression   .  ADHD (attention deficit hyperactivity disorder)      Past Surgical History  Procedure Date  . Spine surgery 1999    Lumbar   . Cholesteatoma excision 1993    left  . Varicocele excision 12/2005     Family History  Problem Relation Age of Onset  . Hyperlipidemia Mother   . Heart attack Mother 43  . Hypertension Brother      History   Social History  . Marital Status: Married    Spouse Name: N/A    Number of Children: N/A  . Years of Education: N/A   Occupational History  . 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 Smoking status: Never Smoker   . Smokeless tobacco: Not on file  . Alcohol Use: No  . Drug Use: No  . Sexually Active:    Other Topics Concern  . Not on file   Social History Narrative  . No narrative on file       PHYSICAL EXAM   BP 136/80  Pulse 100  Ht 5\' 10"  (1.778 m)  Wt 219 lb 12 oz (99.678 kg)  BMI 31.53 kg/m2 Constitutional: He is oriented to person, place, and time. He appears well-developed and well-nourished. No distress.  HENT: No nasal discharge.  Head: Normocephalic and atraumatic.  Eyes: Pupils are equal and round. Right eye exhibits no discharge. Left eye exhibits no discharge.  Neck: Normal range of motion. Neck supple. No JVD present. No thyromegaly present.  Cardiovascular: Normal rate, regular rhythm, normal heart sounds and. Exam reveals no gallop and no friction rub. No murmur heard.  Pulmonary/Chest: Effort normal and breath sounds normal. No stridor. No respiratory distress. He has no wheezes. He has no rales. He exhibits no tenderness.  Abdominal: Soft. Bowel sounds are normal. He exhibits no distension. There is no tenderness. There is no rebound and no guarding.  Musculoskeletal: Normal range of motion. He exhibits no edema and no tenderness.  Neurological: He is alert and oriented to person, place, and time. Coordination normal.  Skin: Skin is warm and dry. No rash noted. He is not diaphoretic.  No erythema. No pallor.  Psychiatric: He has a normal mood and affect. His behavior is normal. Judgment and thought content normal.       ASSESSMENT AND PLAN

## 2012-12-01 ENCOUNTER — Ambulatory Visit: Payer: Self-pay | Admitting: Otolaryngology

## 2012-12-02 LAB — PATHOLOGY REPORT

## 2012-12-23 ENCOUNTER — Other Ambulatory Visit: Payer: Self-pay | Admitting: *Deleted

## 2012-12-24 MED ORDER — ROSUVASTATIN CALCIUM 40 MG PO TABS: 40.0000 mg | ORAL_TABLET | Freq: Every day | ORAL | Status: DC

## 2013-01-20 ENCOUNTER — Ambulatory Visit: Payer: PRIVATE HEALTH INSURANCE | Admitting: Family Medicine

## 2013-01-22 ENCOUNTER — Encounter: Payer: Self-pay | Admitting: Family Medicine

## 2013-01-22 ENCOUNTER — Ambulatory Visit (INDEPENDENT_AMBULATORY_CARE_PROVIDER_SITE_OTHER)
Admission: RE | Admit: 2013-01-22 | Discharge: 2013-01-22 | Disposition: A | Discharge status: Home / Self Care | Payer: PRIVATE HEALTH INSURANCE | Source: Ambulatory Visit | Attending: Family Medicine | Admitting: Family Medicine

## 2013-01-22 ENCOUNTER — Ambulatory Visit (INDEPENDENT_AMBULATORY_CARE_PROVIDER_SITE_OTHER): Payer: PRIVATE HEALTH INSURANCE | Admitting: Family Medicine

## 2013-01-22 VITALS — BP 160/104 | HR 76 | Temp 97.8°F | Wt 225.0 lb

## 2013-01-22 DIAGNOSIS — M79609 Pain in unspecified limb: Secondary | ICD-10-CM

## 2013-01-22 DIAGNOSIS — M79602 Pain in left arm: Secondary | ICD-10-CM | POA: Insufficient documentation

## 2013-01-22 DIAGNOSIS — M542 Cervicalgia: Secondary | ICD-10-CM

## 2013-01-22 DIAGNOSIS — I10 Essential (primary) hypertension: Secondary | ICD-10-CM

## 2013-01-22 DIAGNOSIS — R51 Headache: Secondary | ICD-10-CM

## 2013-01-22 MED ORDER — LISINOPRIL-HYDROCHLOROTHIAZIDE 10-12.5 MG PO TABS: 1.0000 | ORAL_TABLET | Freq: Every day | ORAL | Status: DC

## 2013-01-22 MED ORDER — DEXAMETHASONE SOD PHOSPHATE PF 10 MG/ML IJ SOLN
10.0000 mg | Freq: Once | INTRAMUSCULAR | Status: AC
  Administered 2013-01-22: 10 mg via INTRAMUSCULAR

## 2013-01-22 NOTE — Patient Instructions (Addendum)
Good to see you. We will call you with your xray results.  We are starting HCTZ- lisinopril for your blood pressure.  Please come see me in 2 weeks.  Please do not take any Ibuprofen, Advil for next 72 hours.

## 2013-01-22 NOTE — Progress Notes (Signed)
Subjective:    Patient ID: [name], male    DOB: 02-25-1966, 47 y.o.   MRN: [id number]  HPI  47 yo here for left arm tingling and neck pain with very elevated BP.  Neck pain with left arm tingling- on going for weeks.  He works for [employer] and has a very physical job.  No known injury but he lifts and pulls heavy objects all day.  Has been having more headaches and neck pain.  If he lifts his arm a certain way, his entire arm tingles down to all of his finger tips.  No UE weakness.  Had a similar episode on right side a few months ago.  HTN- Saw Dr. Kirke Corin in January and BP was 136/68.  Did report seeing his sinus surgeon 3 weeks ago and BP was even higher than it was today. Does have strong FH of HTN and CAD.  BP Readings from Last 3 Encounters:  01/22/13 160/104  11/06/12 136/80  08/25/12 140/82   Denies any HA, blurred vision, CP or SOB.  Patient Active Problem List  Diagnosis  . HYPERLIPIDEMIA  . ANXIETY  . URI  . ALLERGIC RHINITIS  . SKIN TAG  . ATAXIA, CEREBELLAR  . FATIGUE  . HEADACHE  . DEPRESSION  . Routine general medical examination at a health care facility  . Left arm pain  . HTN (hypertension)   Past Medical History  Diagnosis Date  . Anxiety   . Hyperlipidemia   . Allergy   . GERD (gastroesophageal reflux disease)   . Depression   . ADHD (attention deficit hyperactivity disorder)    Past Surgical History  Procedure Laterality Date  . Spine surgery  1999    Lumbar   . Cholesteatoma excision  1993    left  . Varicocele excision  12/2005   History  Substance Use Topics  . Smoking status: Never Smoker   . Smokeless tobacco: Not on file  . Alcohol Use: No   Family History  Problem Relation Age of Onset  . Hyperlipidemia Mother   . Heart attack Mother 44  . Hypertension Brother    Allergies  Allergen Reactions  . Bupropion Hcl     REACTION: anxiety  . Fluoxetine Hcl     REACTION: more anxious and tired   Current Outpatient  Prescriptions on File Prior to Visit  Medication Sig Dispense Refill  . ezetimibe (ZETIA) 10 MG tablet Take 1 tablet (10 mg total) by mouth daily.  15 tablet  0  . FLUoxetine (PROZAC) 10 MG capsule Take 10 mg by mouth daily.        . methylphenidate (RITALIN) 20 MG tablet Take 20 mg by mouth daily.      Marland Kitchen omeprazole (PRILOSEC) 40 MG capsule Take 40 mg by mouth daily.        . rosuvastatin (CRESTOR) 40 MG tablet Take 1 tablet (40 mg total) by mouth daily.  90 tablet  3   No current facility-administered medications on file prior to visit.   The PMH, PSH, Social History, Family History, Medications, and allergies have been reviewed in [facility], and have been updated if relevant.   Review of Systems    + HA Objective:   Physical Exam  Constitutional: He appears well-developed and well-nourished. No distress.  HENT:  Head: Normocephalic.  Neck: Neck supple. Spinous process tenderness present. No erythema and normal range of motion present.  Musculoskeletal:  Left shoulder normal to inspection and palpation. Neg arch  Neg empty can Grip strength normal bilaterally   BP 160/104  Pulse 76  Temp(Src) 97.8 F (36.6 C)  Wt 225 lb (102.059 kg)  BMI 32.28 kg/m2    Assessment & Plan:  1. Left arm pain New- concerning for cervical source- nerve impingement.  Will order cervical xray today. Decadron IM given to decrease inflammation. - DG Cervical Spine Complete; Future  2. HTN (hypertension) New- Start lisinopril- HCTZ 10-12.5 mg daily.  Follow up in 2 weeks.  3. Neck pain See #1 - DG Cervical Spine Complete; Future  4. Headache Deteriorate most likely related to #1.

## 2013-01-22 NOTE — Addendum Note (Signed)
Addended by: Eliezer Bottom on: 01/22/2013 10:09 AM   Modules accepted: Orders

## 2013-01-23 ENCOUNTER — Other Ambulatory Visit: Payer: Self-pay | Admitting: Family Medicine

## 2013-01-23 DIAGNOSIS — M25512 Pain in left shoulder: Secondary | ICD-10-CM

## 2013-02-06 ENCOUNTER — Ambulatory Visit: Payer: PRIVATE HEALTH INSURANCE | Admitting: Family Medicine

## 2013-04-10 ENCOUNTER — Other Ambulatory Visit: Payer: Self-pay

## 2013-04-10 MED ORDER — ROSUVASTATIN CALCIUM 40 MG PO TABS: 40.0000 mg | ORAL_TABLET | Freq: Every day | ORAL | Status: DC

## 2013-04-10 NOTE — Telephone Encounter (Signed)
Refill sent for crestor.  

## 2013-04-28 IMAGING — CR DG CHEST 1V PORT
1 series · 1 of 1 positions shown · non-contrast
Comparison: none

REASON FOR EXAM: G Tsoh Atang02
COMMENTS:

PROCEDURE:     DXR - DXR PORTABLE CHEST SINGLE VIEW  - December 01, 2012  [DATE]
RESULT:     The lungs are clear. The cardiac silhouette and visualized bony
skeleton are unremarkable.

[ap]
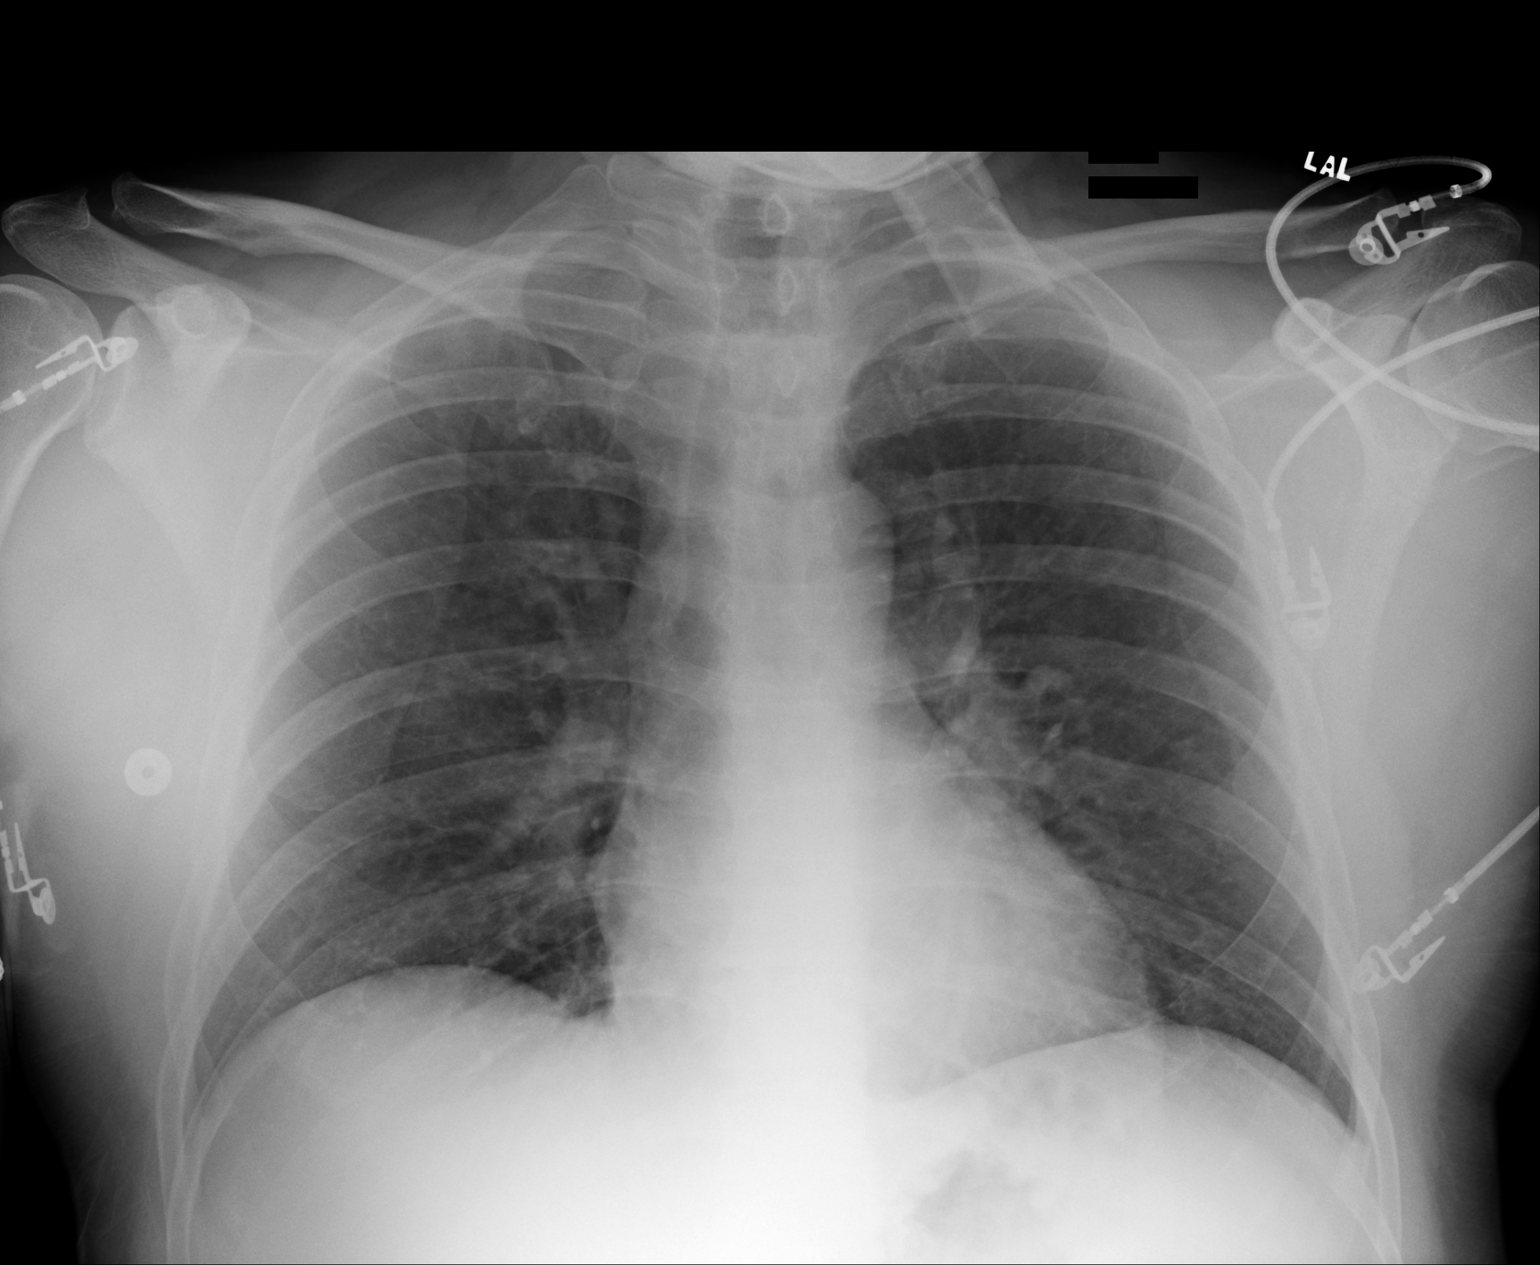

[1 of 1 positions shown; findings below may reference images not displayed]

IMPRESSION: 1. Chest radiograph without evidence of acute cardiopulmonary disease.

## 2013-07-09 ENCOUNTER — Other Ambulatory Visit: Payer: Self-pay | Admitting: *Deleted

## 2013-07-09 MED ORDER — ROSUVASTATIN CALCIUM 40 MG PO TABS: 40.0000 mg | ORAL_TABLET | Freq: Every day | ORAL | Status: DC

## 2013-07-09 NOTE — Telephone Encounter (Signed)
Refilled Crestor sent to [facility].  #[phone] R#3

## 2013-11-02 ENCOUNTER — Other Ambulatory Visit: Payer: Self-pay | Admitting: Internal Medicine

## 2013-11-02 DIAGNOSIS — Z0000 Encounter for general adult medical examination without abnormal findings: Secondary | ICD-10-CM

## 2013-11-04 ENCOUNTER — Other Ambulatory Visit: Payer: PRIVATE HEALTH INSURANCE

## 2013-11-06 ENCOUNTER — Other Ambulatory Visit (INDEPENDENT_AMBULATORY_CARE_PROVIDER_SITE_OTHER): Payer: PRIVATE HEALTH INSURANCE

## 2013-11-06 DIAGNOSIS — Z0000 Encounter for general adult medical examination without abnormal findings: Secondary | ICD-10-CM

## 2013-11-06 LAB — CBC WITH DIFFERENTIAL/PLATELET
BASOS ABS: 0 10*3/uL (ref 0.0–0.1)
Basophils Relative: 0.6 % (ref 0.0–3.0)
EOS ABS: 0.3 10*3/uL (ref 0.0–0.7)
EOS PCT: 4.1 % (ref 0.0–5.0)
HCT: 41.4 % (ref 39.0–52.0)
Hemoglobin: 14.1 g/dL (ref 13.0–17.0)
Lymphocytes Relative: 35.2 % (ref 12.0–46.0)
Lymphs Abs: 2.6 10*3/uL (ref 0.7–4.0)
MCHC: 34 g/dL (ref 30.0–36.0)
MCV: 81.9 fl (ref 78.0–100.0)
MONO ABS: 0.5 10*3/uL (ref 0.1–1.0)
Monocytes Relative: 6.6 % (ref 3.0–12.0)
NEUTROS PCT: 53.5 % (ref 43.0–77.0)
Neutro Abs: 3.9 10*3/uL (ref 1.4–7.7)
Platelets: 255 10*3/uL (ref 150.0–400.0)
RBC: 5.06 Mil/uL (ref 4.22–5.81)
RDW: 14.8 % — ABNORMAL HIGH (ref 11.5–14.6)
WBC: 7.4 10*3/uL (ref 4.5–10.5)

## 2013-11-06 LAB — COMPREHENSIVE METABOLIC PANEL
ALK PHOS: 64 U/L (ref 39–117)
ALT: 26 U/L (ref 0–53)
AST: 18 U/L (ref 0–37)
Albumin: 4.5 g/dL (ref 3.5–5.2)
BILIRUBIN TOTAL: 0.8 mg/dL (ref 0.3–1.2)
BUN: 18 mg/dL (ref 6–23)
CO2: 30 mEq/L (ref 19–32)
Calcium: 9.1 mg/dL (ref 8.4–10.5)
Chloride: 102 mEq/L (ref 96–112)
Creatinine, Ser: 1.1 mg/dL (ref 0.4–1.5)
GFR: 75.67 mL/min (ref >60.00)
Glucose, Bld: 91 mg/dL (ref 70–99)
POTASSIUM: 4.1 meq/L (ref 3.5–5.1)
Sodium: 138 mEq/L (ref 135–145)
TOTAL PROTEIN: 7.2 g/dL (ref 6.0–8.3)

## 2013-11-06 LAB — LDL CHOLESTEROL, DIRECT: Direct LDL: 264.3 mg/dL

## 2013-11-06 LAB — TSH: TSH: 2.19 u[IU]/mL (ref 0.35–5.50)

## 2013-11-06 LAB — LIPID PANEL
CHOL/HDL RATIO: 9
Cholesterol: 339 mg/dL — ABNORMAL HIGH (ref 0–200)
HDL: 37.5 mg/dL — ABNORMAL LOW (ref >39.00)
Triglycerides: 102 mg/dL (ref 0.0–149.0)
VLDL: 20.4 mg/dL (ref 0.0–40.0)

## 2013-11-11 ENCOUNTER — Ambulatory Visit (INDEPENDENT_AMBULATORY_CARE_PROVIDER_SITE_OTHER): Payer: PRIVATE HEALTH INSURANCE | Admitting: Internal Medicine

## 2013-11-11 ENCOUNTER — Encounter: Payer: Self-pay | Admitting: Internal Medicine

## 2013-11-11 VITALS — BP 142/84 | HR 80 | Temp 97.8°F | Ht 69.0 in | Wt 198.5 lb

## 2013-11-11 DIAGNOSIS — Z0000 Encounter for general adult medical examination without abnormal findings: Secondary | ICD-10-CM

## 2013-11-11 DIAGNOSIS — E785 Hyperlipidemia, unspecified: Secondary | ICD-10-CM

## 2013-11-11 DIAGNOSIS — I10 Essential (primary) hypertension: Secondary | ICD-10-CM

## 2013-11-11 MED ORDER — LISINOPRIL-HYDROCHLOROTHIAZIDE 10-12.5 MG PO TABS: 1.0000 | ORAL_TABLET | Freq: Every day | ORAL | Status: DC

## 2013-11-11 NOTE — Assessment & Plan Note (Signed)
He has not been taking his blood pressure medication ? This is the cause of his headaches He will start taking it daily

## 2013-11-11 NOTE — Assessment & Plan Note (Signed)
Severe He is cutting his crestor in half Advised him to take a whole pill daily informatin provided about diet  RTC in 3 months to recheck labs

## 2013-11-11 NOTE — Progress Notes (Signed)
Subjective:    Patient ID: [name], male    DOB: [birth year], 48 y.o.   MRN: [id number]  HPI  Pt presents to the clinic today for his annual exam. He continues to have headaches around his eyes. He had sinus surgery in 12/2012 which did not help. He tried botox injections 3 weeks ago which did not help. He does report a lot of stress and thinks this may be the cause. He is followed by a psychiatrist.  Flu: 08/2013 at [facility] Tetanus: 2011 Dentist: biannually Eye Doctor: annually  Of note: he is cutting his crestor in half and he is not taking his blood pressure medication.   Review of Systems      Past Medical History  Diagnosis Date  . Anxiety   . Hyperlipidemia   . Allergy   . GERD (gastroesophageal reflux disease)   . Depression   . ADHD (attention deficit hyperactivity disorder)     Current Outpatient Prescriptions  Medication Sig Dispense Refill  . FLUoxetine (PROZAC) 10 MG capsule Take 40 mg by mouth daily.       Marland Kitchen lisinopril-hydrochlorothiazide (PRINZIDE,ZESTORETIC) 10-12.5 MG per tablet Take 1 tablet by mouth daily.  90 tablet  3  . methylphenidate (RITALIN) 20 MG tablet Take 20 mg by mouth daily.      Marland Kitchen omeprazole (PRILOSEC) 40 MG capsule Take 40 mg by mouth daily.        . rosuvastatin (CRESTOR) 40 MG tablet Take 1 tablet (40 mg total) by mouth daily.  30 tablet  3   No current facility-administered medications for this visit.    Allergies  Allergen Reactions  . Bupropion Hcl     REACTION: anxiety  . Fluoxetine Hcl     REACTION: more anxious and tired    Family History  Problem Relation Age of Onset  . Hyperlipidemia Mother   . Heart attack Mother 48  . Hypertension Brother     History   Social History  . Marital Status: Married    Spouse Name: N/A    Number of Children: N/A  . 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 Smoking status: Never Smoker   . Smokeless tobacco:  Not on file  . Alcohol Use: No  . Drug Use: No  . Sexual Activity: Not on file   Other Topics Concern  . Not on file   Social History Narrative  . No narrative on file     Constitutional: Denies fever, malaise, fatigue,  or abrupt weight changes.  HEENT: Denies eye pain, eye redness, ear pain, ringing in the ears, wax buildup, runny nose, nasal congestion, bloody nose, or sore throat. Respiratory: Denies difficulty breathing, shortness of breath, cough or sputum production.   Cardiovascular: Denies chest pain, chest tightness, palpitations or swelling in the hands or feet.  Gastrointestinal: Denies abdominal pain, bloating, constipation, diarrhea or blood in the stool.  GU: Denies urgency, frequency, pain with urination, burning sensation, blood in urine, odor or discharge. Musculoskeletal: Denies decrease in range of motion, difficulty with gait, muscle pain or joint pain and swelling.  Skin: Denies redness, rashes, lesions or ulcercations.  Neurological: Denies dizziness, difficulty with memory, difficulty with speech or problems with balance and coordination.   No other specific complaints in a complete review of systems (except as listed in HPI above).  Objective:   Physical Exam  BP 142/84  Pulse 80  Temp(Src) 97.8 F (36.6 C) (Oral)  Ht '5\' 9"'  (1.753 m)  Wt 198 lb 8 oz (90.039 kg)  BMI 29.30 kg/m2  SpO2 98% Wt Readings from Last 3 Encounters:  11/11/13 198 lb 8 oz (90.039 kg)  01/22/13 225 lb (102.059 kg)  11/06/12 219 lb 12 oz (99.678 kg)    General: Appears his stated age, well developed, well nourished in NAD. Skin: Warm, dry and intact. No rashes, lesions or ulcerations noted. HEENT: Head: normal shape and size; Eyes: sclera white, no icterus, conjunctiva pink, PERRLA and EOMs intact; Ears: Tm's gray and intact, normal light reflex; Nose: mucosa pink and moist, septum midline; Throat/Mouth: Teeth present, mucosa pink and moist, no exudate, lesions or ulcerations  noted.  Neck: Normal range of motion. Neck supple, trachea midline. No massses, lumps or thyromegaly present.  Cardiovascular: Normal rate and rhythm. S1,S2 noted.  No murmur, rubs or gallops noted. No JVD or BLE edema. No carotid bruits noted. Pulmonary/Chest: Normal effort and positive vesicular breath sounds. No respiratory distress. No wheezes, rales or ronchi noted.  Abdomen: Soft and nontender. Normal bowel sounds, no bruits noted. No distention or masses noted. Liver, spleen and kidneys non palpable. Musculoskeletal: Normal range of motion. No signs of joint swelling. No difficulty with gait.  Neurological: Alert and oriented. Cranial nerves II-XII intact. Coordination normal. +DTRs bilaterally. Psychiatric: Mood and affect normal. Behavior is normal. Judgment and thought content normal.     BMET    Component Value Date/Time   NA 138 11/06/2013 1251   K 4.1 11/06/2013 1251   CL 102 11/06/2013 1251   CO2 30 11/06/2013 1251   GLUCOSE 91 11/06/2013 1251   BUN 18 11/06/2013 1251   CREATININE 1.1 11/06/2013 1251   CALCIUM 9.1 11/06/2013 1251   GFRNONAA 82.93 03/21/2010 1557   GFRAA  Value: >60        The eGFR has been calculated using the MDRD equation. This calculation has not been validated in all clinical situations. eGFR's persistently <60 mL/min signify possible Chronic Kidney Disease. 11/24/2009 1945    Lipid Panel     Component Value Date/Time   CHOL 339* 11/06/2013 1251   TRIG 102.0 11/06/2013 1251   HDL 37.50* 11/06/2013 1251   HDL 42 09/25/2012 1554   CHOLHDL 9 11/06/2013 1251   CHOLHDL 5.8* 09/25/2012 1554   VLDL 20.4 11/06/2013 1251   LDLCALC 164* 09/25/2012 1554    CBC    Component Value Date/Time   WBC 7.4 11/06/2013 1251   RBC 5.06 11/06/2013 1251   HGB 14.1 11/06/2013 1251   HCT 41.4 11/06/2013 1251   PLT 255.0 11/06/2013 1251   MCV 81.9 11/06/2013 1251   MCHC 34.0 11/06/2013 1251   RDW 14.8* 11/06/2013 1251   LYMPHSABS 2.6 11/06/2013 1251   MONOABS 0.5 11/06/2013 1251   EOSABS 0.3 11/06/2013  1251   BASOSABS 0.0 11/06/2013 1251    Hgb A1C No results found for this basename: HGBA1C         Assessment & Plan:   Preventative Health Maintenance:  All HM UTD We reviewed his labs at the visit Advised him to work on diet and exercise  RTC in 3 months to recheck labs

## 2013-11-11 NOTE — Progress Notes (Signed)
Pre-visit discussion using our clinic review tool. No additional management support is needed unless otherwise documented below in the visit note.  

## 2013-11-11 NOTE — Patient Instructions (Signed)
Health Maintenance, Males A healthy lifestyle and preventative care can promote health and wellness.  Maintain regular health, dental, and eye exams.  Eat a healthy diet. Foods like vegetables, fruits, whole grains, low-fat dairy products, and lean protein foods contain the nutrients you need without too many calories. Decrease your intake of foods high in solid fats, added sugars, and salt. Get information about a proper diet from your caregiver, if necessary.  Regular physical exercise is one of the most important things you can do for your health. Most adults should get at least 150 minutes of moderate-intensity exercise (any activity that increases your heart rate and causes you to sweat) each week. In addition, most adults need muscle-strengthening exercises on 2 or more days a week.   Maintain a healthy weight. The body mass index (BMI) is a screening tool to identify possible weight problems. It provides an estimate of body fat based on height and weight. Your caregiver can help determine your BMI, and can help you achieve or maintain a healthy weight. For adults 20 years and older:  A BMI below 18.5 is considered underweight.  A BMI of 18.5 to 24.9 is normal.  A BMI of 25 to 29.9 is considered overweight.  A BMI of 30 and above is considered obese.  Maintain normal blood lipids and cholesterol by exercising and minimizing your intake of saturated fat. Eat a balanced diet with plenty of fruits and vegetables. Blood tests for lipids and cholesterol should begin at age 84 and be repeated every 5 years. If your lipid or cholesterol levels are high, you are over 50, or you are a high risk for heart disease, you may need your cholesterol levels checked more frequently.Ongoing high lipid and cholesterol levels should be treated with medicines, if diet and exercise are not effective.  If you smoke, find out from your caregiver how to quit. If you do not use tobacco, do not start.  Lung  cancer screening is recommended for adults aged 62 80 years who are at high risk for developing lung cancer because of a history of smoking. Yearly low-dose computed tomography (CT) is recommended for people who have at least a 30-pack-year history of smoking and are a current smoker or have quit within the past 15 years. A pack year of smoking is smoking an average of 1 pack of cigarettes a day for 1 year (for example: 1 pack a day for 30 years or 2 packs a day for 15 years). Yearly screening should continue until the smoker has stopped smoking for at least 15 years. Yearly screening should also be stopped for people who develop a health problem that would prevent them from having lung cancer treatment.  If you choose to drink alcohol, do not exceed 2 drinks per day. One drink is considered to be 12 ounces (355 mL) of beer, 5 ounces (148 mL) of wine, or 1.5 ounces (44 mL) of liquor.  Avoid use of street drugs. Do not share needles with anyone. Ask for help if you need support or instructions about stopping the use of drugs.  High blood pressure causes heart disease and increases the risk of stroke. Blood pressure should be checked at least every 1 to 2 years. Ongoing high blood pressure should be treated with medicines if weight loss and exercise are not effective.  If you are 32 to 47 years old, ask your caregiver if you should take aspirin to prevent heart disease.  Diabetes screening involves taking a blood  sample to check your fasting blood sugar level. This should be done once every 3 years, after age 80, if you are within normal weight and without risk factors for diabetes. Testing should be considered at a younger age or be carried out more frequently if you are overweight and have at least 1 risk factor for diabetes.  Colorectal cancer can be detected and often prevented. Most routine colorectal cancer screening begins at the age of 72 and continues through age 62. However, your caregiver may  recommend screening at an earlier age if you have risk factors for colon cancer. On a yearly basis, your caregiver may provide home test kits to check for hidden blood in the stool. Use of a small camera at the end of a tube, to directly examine the colon (sigmoidoscopy or colonoscopy), can detect the earliest forms of colorectal cancer. Talk to your caregiver about this at age 14, when routine screening begins. Direct examination of the colon should be repeated every 5 to 10 years through age 100, unless early forms of pre-cancerous polyps or small growths are found.  Hepatitis C blood testing is recommended for all people born from [birth year] through 1964 and any individual with known risks for hepatitis C.  Healthy men should no longer receive prostate-specific antigen (PSA) blood tests as part of routine cancer screening. Consult with your caregiver about prostate cancer screening.  Testicular cancer screening is not recommended for adolescents or adult males who have no symptoms. Screening includes self-exam, caregiver exam, and other screening tests. Consult with your caregiver about any symptoms you have or any concerns you have about testicular cancer.  Practice safe sex. Use condoms and avoid high-risk sexual practices to reduce the spread of sexually transmitted infections (STIs).  Use sunscreen. Apply sunscreen liberally and repeatedly throughout the day. You should seek shade when your shadow is shorter than you. Protect yourself by wearing long sleeves, pants, a wide-brimmed hat, and sunglasses year round, whenever you are outdoors.  Notify your caregiver of new moles or changes in moles, especially if there is a change in shape or color. Also notify your caregiver if a mole is larger than the size of a pencil eraser.  A one-time screening for abdominal aortic aneurysm (AAA) and surgical repair of large AAAs by sound wave imaging (ultrasonography) is recommended for ages 8 to 68 years who are  current or former smokers.  Stay current with your immunizations. Document Released: 04/19/2008 Document Revised: 02/16/2013 Document Reviewed: 03/19/2011 [facility] Patient Information 2014 [place], [place].

## 2013-11-12 ENCOUNTER — Telehealth: Payer: Self-pay | Admitting: Family Medicine

## 2013-11-12 NOTE — Telephone Encounter (Signed)
Relevant patient education assigned to patient using Emmi. ° °

## 2014-01-01 ENCOUNTER — Telehealth: Payer: Self-pay | Admitting: *Deleted

## 2014-01-01 DIAGNOSIS — E785 Hyperlipidemia, unspecified: Secondary | ICD-10-CM

## 2014-01-01 NOTE — Telephone Encounter (Signed)
Spoke to pt and he recently had CPE. He is requesting to change statin from Crestor to Lipitor if possible. pls advise

## 2014-01-04 NOTE — Telephone Encounter (Signed)
Cholesterol is very very high so if we do change it, I would like to add Zetia as well. Please let me know and I will send rxs to pharmacy of his choice.

## 2014-01-04 NOTE — Telephone Encounter (Signed)
Lm on pts vm requesting a call back 

## 2014-01-05 MED ORDER — ATORVASTATIN CALCIUM 40 MG PO TABS: 40.0000 mg | ORAL_TABLET | Freq: Every day | ORAL | Status: DC

## 2014-01-05 NOTE — Telephone Encounter (Signed)
Lm on pts vm requesting a call back 

## 2014-01-05 NOTE — Telephone Encounter (Signed)
Rx sent as requested.  Please make sure he has an appt to recheck lipid panel and CMET in 8 weeks (272.4)

## 2014-01-05 NOTE — Addendum Note (Signed)
Addended by: Lucille Passy on: 01/05/2014 03:42 PM   Modules accepted: Orders, Medications

## 2014-01-05 NOTE — Telephone Encounter (Signed)
Spoke to pt who states that he is wanting to change to Zetia and Lipitor. He states that he currently has some Zetia and is only needing new Rx for Lipitor

## 2014-01-06 NOTE — Telephone Encounter (Signed)
Lm on pts vm informing him Rx has been sent to requested pharmacy. Repeat labs entered

## 2014-01-06 NOTE — Addendum Note (Signed)
Addended by: Modena Nunnery on: 01/06/2014 08:43 AM   Modules accepted: Orders

## 2014-02-23 ENCOUNTER — Other Ambulatory Visit: Payer: Self-pay | Admitting: *Deleted

## 2014-02-23 MED ORDER — ATORVASTATIN CALCIUM 40 MG PO TABS: 40.0000 mg | ORAL_TABLET | Freq: Every day | ORAL | Status: DC

## 2014-07-01 ENCOUNTER — Other Ambulatory Visit (INDEPENDENT_AMBULATORY_CARE_PROVIDER_SITE_OTHER): Payer: PRIVATE HEALTH INSURANCE

## 2014-07-01 DIAGNOSIS — I10 Essential (primary) hypertension: Secondary | ICD-10-CM

## 2014-07-01 DIAGNOSIS — E785 Hyperlipidemia, unspecified: Secondary | ICD-10-CM

## 2014-07-02 LAB — COMPREHENSIVE METABOLIC PANEL
ALT: 29 U/L (ref 0–53)
AST: 21 U/L (ref 0–37)
Albumin: 4.4 g/dL (ref 3.5–5.2)
Alkaline Phosphatase: 70 U/L (ref 39–117)
BUN: 19 mg/dL (ref 6–23)
CALCIUM: 9.8 mg/dL (ref 8.4–10.5)
CHLORIDE: 100 meq/L (ref 96–112)
CO2: 31 mEq/L (ref 19–32)
Creatinine, Ser: 1.1 mg/dL (ref 0.4–1.5)
GFR: 74.68 mL/min (ref >60.00)
Glucose, Bld: 76 mg/dL (ref 70–99)
Potassium: 4.2 mEq/L (ref 3.5–5.1)
SODIUM: 139 meq/L (ref 135–145)
Total Bilirubin: 0.9 mg/dL (ref 0.2–1.2)
Total Protein: 7.7 g/dL (ref 6.0–8.3)

## 2014-07-15 ENCOUNTER — Ambulatory Visit: Payer: PRIVATE HEALTH INSURANCE | Admitting: Family Medicine

## 2014-07-16 ENCOUNTER — Telehealth: Payer: Self-pay | Admitting: Family Medicine

## 2014-07-16 NOTE — Telephone Encounter (Signed)
Yes please call pt to reschedule.

## 2014-07-16 NOTE — Telephone Encounter (Signed)
Patient did not come for their scheduled appointment 07/15/14 forF/U,BP,CHOLESTEROL  .  Please let me know if the patient needs to be contacted immediately for follow up or if no follow up is necessary.

## 2014-07-19 NOTE — Telephone Encounter (Signed)
Appointment 9/15

## 2014-07-20 ENCOUNTER — Encounter: Payer: Self-pay | Admitting: Family Medicine

## 2014-07-20 ENCOUNTER — Ambulatory Visit (INDEPENDENT_AMBULATORY_CARE_PROVIDER_SITE_OTHER): Payer: PRIVATE HEALTH INSURANCE | Admitting: Family Medicine

## 2014-07-20 VITALS — BP 138/76 | HR 81 | Temp 98.1°F | Wt 212.5 lb

## 2014-07-20 DIAGNOSIS — E785 Hyperlipidemia, unspecified: Secondary | ICD-10-CM

## 2014-07-20 DIAGNOSIS — Z23 Encounter for immunization: Secondary | ICD-10-CM

## 2014-07-20 DIAGNOSIS — I10 Essential (primary) hypertension: Secondary | ICD-10-CM

## 2014-07-20 LAB — LIPID PANEL
CHOL/HDL RATIO: 13
Cholesterol: 420 mg/dL — ABNORMAL HIGH (ref 0–200)
HDL: 32.1 mg/dL — AB (ref >39.00)
NONHDL: 387.9
VLDL: 100 mg/dL — ABNORMAL HIGH (ref 0.0–40.0)

## 2014-07-20 LAB — LDL CHOLESTEROL, DIRECT: LDL DIRECT: 309.7 mg/dL

## 2014-07-20 MED ORDER — LISINOPRIL-HYDROCHLOROTHIAZIDE 10-12.5 MG PO TABS: 1.0000 | ORAL_TABLET | Freq: Every day | ORAL | Status: DC

## 2014-07-20 MED ORDER — ROSUVASTATIN CALCIUM 40 MG PO TABS
40.0000 mg | ORAL_TABLET | Freq: Every day | ORAL | Status: DC
Start: 2014-07-20 — End: 2014-08-13

## 2014-07-20 NOTE — Patient Instructions (Addendum)
Great to see you. I will you with your lab results.  Please restart your crestor.

## 2014-07-20 NOTE — Progress Notes (Signed)
Pre visit review using our clinic review tool, if applicable. No additional management support is needed unless otherwise documented below in the visit note. 

## 2014-07-20 NOTE — Addendum Note (Signed)
Addended by: Modena Nunnery on: 07/20/2014 08:54 AM   Modules accepted: Orders

## 2014-07-20 NOTE — Progress Notes (Signed)
Subjective:   Patient ID: [name], male    DOB: 1966-09-19, 48 y.o.   MRN: [id number]  [name] is a pleasant 48 y.o. year old male who presents to clinic today with Follow-up  on 07/20/2014  HPI: HLD- saw Webb Silversmith on 11/11/13.  At that time, advised him to take his whole crestor tablet daily (was cutting it in half).  He called in February 2015 (see phone note), requested a change to lipitor and zetia due to cost. Here for follow up.  He is taking lipitor a few times a week and not taking Zetia.  Lab Results  Component Value Date   CHOL 339* 11/06/2013   HDL 37.50* 11/06/2013   LDLCALC 164* 09/25/2012   LDLDIRECT 264.3 11/06/2013   TRIG 102.0 11/06/2013   CHOLHDL 9 11/06/2013   HTN-was not taking his BP medication regularly when he saw [place].  She advised him to restart it.  Was taking Prinzide 10/12.5 mg daily but ran out a couple of weeks ago. Denies HA, blurred vision, CP or SOB. Lab Results  Component Value Date   CREATININE 1.1 07/02/2014    Current Outpatient Prescriptions on File Prior to Visit  Medication Sig Dispense Refill  . atorvastatin (LIPITOR) 40 MG tablet Take 1 tablet (40 mg total) by mouth daily.  90 tablet  0  . FLUoxetine (PROZAC) 10 MG capsule Take 40 mg by mouth daily.       . methylphenidate (RITALIN) 20 MG tablet Take 20 mg by mouth daily.      Marland Kitchen omeprazole (PRILOSEC) 40 MG capsule Take 40 mg by mouth daily.         No current facility-administered medications on file prior to visit.    Allergies  Allergen Reactions  . Bupropion Hcl     REACTION: anxiety  . Fluoxetine Hcl     REACTION: more anxious and tired    Past Medical History  Diagnosis Date  . Anxiety   . Hyperlipidemia   . Allergy   . GERD (gastroesophageal reflux disease)   . Depression   . ADHD (attention deficit hyperactivity disorder)     Past Surgical History  Procedure Laterality Date  . Spine surgery  1999    Lumbar   . Cholesteatoma excision  1993    left  .  Varicocele excision  12/2005    Family History  Problem Relation Age of Onset  . Hyperlipidemia Mother   . Heart attack Mother 57  . Hypertension Brother     History   Social History  . Marital Status: Married    Spouse Name: N/A    Number of Children: N/A  . 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 Smoking status: Never Smoker   . Smokeless tobacco: Not on file  . Alcohol Use: No  . Drug Use: No  . Sexual Activity: Not on file   Other Topics Concern  . Not on file   Social History Narrative  . No narrative on file   The PMH, PSH, Social History, Family History, Medications, and allergies have been reviewed in [facility], and have been updated if relevant.   Review of Systems See HPI No CP No SOB +myalgias with lipitor (more than crestor) No LE edema No blurred vision    Objective:    BP 138/76  Pulse 81  Temp(Src) 98.1 F (36.7 C) (Oral)  Wt 212 lb 8 oz (96.389 kg)  SpO2 96%   Physical Exam  Nursing note and vitals reviewed. Constitutional: He appears well-developed and well-nourished. No distress.  HENT:  Head: Normocephalic.  Cardiovascular: Normal rate and regular rhythm.   Pulmonary/Chest: Effort normal and breath sounds normal. No respiratory distress. He has no wheezes.  Musculoskeletal: He exhibits no edema.  Psychiatric: He has a normal mood and affect. His behavior is normal. Judgment and thought content normal.          Assessment & Plan:   Essential hypertension  HYPERLIPIDEMIA - Plan: Lipid panel No Follow-up on file.

## 2014-07-20 NOTE — Assessment & Plan Note (Signed)
Stable on current rx. eRx sent.

## 2014-07-20 NOTE — Assessment & Plan Note (Signed)
Likely deteriorated. He agrees to restart crestor- he thought he felt better on this. Check lipid panel today. D/c lipitor. erx sent for crestor.

## 2014-07-26 ENCOUNTER — Other Ambulatory Visit: Payer: Self-pay | Admitting: Family Medicine

## 2014-07-26 MED ORDER — OMEPRAZOLE 40 MG PO CPDR: 40.0000 mg | DELAYED_RELEASE_CAPSULE | Freq: Every day | ORAL | Status: DC

## 2014-08-13 ENCOUNTER — Other Ambulatory Visit: Payer: Self-pay | Admitting: *Deleted

## 2014-08-13 MED ORDER — ROSUVASTATIN CALCIUM 40 MG PO TABS: 40.0000 mg | ORAL_TABLET | Freq: Every day | ORAL | Status: DC

## 2014-08-30 ENCOUNTER — Telehealth: Payer: Self-pay

## 2014-08-30 MED ORDER — LISINOPRIL-HYDROCHLOROTHIAZIDE 10-12.5 MG PO TABS: 1.0000 | ORAL_TABLET | Freq: Every day | ORAL | Status: DC

## 2014-08-30 NOTE — Telephone Encounter (Signed)
Pt left v/m requesting [facility]ill [facility]; 07/20/14 pt only got # [facility]. Left v/m advising refill done.

## 2014-09-03 NOTE — Telephone Encounter (Signed)
Onnie Boer pharmacist with [facility] left v/m; received lisinopril HCTZ refill and pt just had refill sent in for lisinopril by Dr Doran Durand. Jim request cb to verify what pt should be taking. Use ref # [id number].Please advise.

## 2014-09-03 NOTE — Telephone Encounter (Signed)
[facility] and verified medication is Lisinopril HCTZ

## 2014-09-03 NOTE — Telephone Encounter (Signed)
He should be taking lisinopril-hctz

## 2014-10-16 ENCOUNTER — Other Ambulatory Visit: Payer: Self-pay | Admitting: Family Medicine

## 2014-11-24 ENCOUNTER — Encounter: Payer: Self-pay | Admitting: Family Medicine

## 2014-11-24 ENCOUNTER — Other Ambulatory Visit: Payer: Self-pay | Admitting: Family Medicine

## 2014-11-24 ENCOUNTER — Ambulatory Visit (INDEPENDENT_AMBULATORY_CARE_PROVIDER_SITE_OTHER): Payer: PRIVATE HEALTH INSURANCE | Admitting: Family Medicine

## 2014-11-24 ENCOUNTER — Ambulatory Visit (INDEPENDENT_AMBULATORY_CARE_PROVIDER_SITE_OTHER)
Admission: RE | Admit: 2014-11-24 | Discharge: 2014-11-24 | Disposition: A | Discharge status: Home / Self Care | Payer: PRIVATE HEALTH INSURANCE | Source: Ambulatory Visit | Attending: Family Medicine | Admitting: Family Medicine

## 2014-11-24 VITALS — BP 130/90 | HR 76 | Temp 97.9°F | Wt 219.0 lb

## 2014-11-24 DIAGNOSIS — R05 Cough: Secondary | ICD-10-CM | POA: Insufficient documentation

## 2014-11-24 DIAGNOSIS — R059 Cough, unspecified: Secondary | ICD-10-CM

## 2014-11-24 DIAGNOSIS — E785 Hyperlipidemia, unspecified: Secondary | ICD-10-CM

## 2014-11-24 DIAGNOSIS — I10 Essential (primary) hypertension: Secondary | ICD-10-CM

## 2014-11-24 DIAGNOSIS — M79602 Pain in left arm: Secondary | ICD-10-CM

## 2014-11-24 DIAGNOSIS — K219 Gastro-esophageal reflux disease without esophagitis: Secondary | ICD-10-CM

## 2014-11-24 DIAGNOSIS — Z125 Encounter for screening for malignant neoplasm of prostate: Secondary | ICD-10-CM

## 2014-11-24 MED ORDER — PANTOPRAZOLE SODIUM 40 MG PO TBEC: 40.0000 mg | DELAYED_RELEASE_TABLET | Freq: Every day | ORAL | Status: DC

## 2014-11-24 MED ORDER — AZITHROMYCIN 250 MG PO TABS: ORAL_TABLET | ORAL | Status: DC

## 2014-11-24 NOTE — Progress Notes (Signed)
Subjective:   Patient ID: [name], male    DOB: Nov 05, 1966, 49 y.o.   MRN: [id number]  [name] is a pleasant 49 y.o. year old male who presents to clinic today with Cough  on 11/24/2014  HPI: Cough-ongoing for past 3-4 months.  He has h/o allergic rhinitis with constant "drainage" but he feels this cough is different.  Often productive of clear phlegm.  No CP or SOB.  He is not a smoker.  Does not feel he is wheezing. No fevers, chills, night sweats or difficulty swallowing.  No hemoptysis.  Appetite good- Wt Readings from Last 3 Encounters:  11/24/14 219 lb (99.338 kg)  07/20/14 212 lb 8 oz (96.389 kg)  11/11/13 198 lb 8 oz (90.039 kg)   Has been on ACEI for years.  GERD- reflux symptoms have been much worse as well lately.  Lamonte Sakai been taking omeprazole 40 mg daily.  Still having burning and sensation of food in his throat regularly.  No dysphagia.   Was seeing GI at [facility] years [facility].  Was scheduled to have an endoscopy but he "chickened out."  HLD- taking crestor 40 mg daily. Denies myalgias.  Lab Results  Component Value Date   CHOL 420* 07/20/2014   HDL 32.10* 07/20/2014   LDLCALC 164* 09/25/2012   LDLDIRECT 309.7 07/20/2014   TRIG * 07/20/2014    500.0 Triglyceride is over 400; calculations on Lipids are invalid.   CHOLHDL 13 07/20/2014   Current Outpatient Prescriptions on File Prior to Visit  Medication Sig Dispense Refill  . FLUoxetine (PROZAC) 10 MG capsule Take 40 mg by mouth daily.     Marland Kitchen lisinopril-hydrochlorothiazide (PRINZIDE,ZESTORETIC) 10-12.5 MG per tablet Take 1 tablet by mouth daily. 90 tablet 1  . methylphenidate (RITALIN) 20 MG tablet Take 20 mg by mouth daily.    . rosuvastatin (CRESTOR) 40 MG tablet Take 1 tablet (40 mg total) by mouth daily. 90 tablet 0   No current facility-administered medications on file prior to visit.    Allergies  Allergen Reactions  . Bupropion Hcl     REACTION: anxiety  . Fluoxetine Hcl     REACTION:  more anxious and tired    Past Medical History  Diagnosis Date  . Anxiety   . Hyperlipidemia   . Allergy   . GERD (gastroesophageal reflux disease)   . Depression   . ADHD (attention deficit hyperactivity disorder)     Past Surgical History  Procedure Laterality Date  . Spine surgery  1999    Lumbar   . Cholesteatoma excision  1993    left  . Varicocele excision  12/2005    Family History  Problem Relation Age of Onset  . Hyperlipidemia Mother   . Heart attack Mother 22  . Hypertension Brother     History   Social History  . Marital Status: Married    Spouse Name: N/A    Number of Children: N/A  . 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 Smoking status: Never Smoker   . Smokeless tobacco: Not on file  . Alcohol Use: No  . Drug Use: No  . Sexual Activity: Not on file   Other Topics Concern  . Not on file   Social History Narrative   The PMH, PSH, Social History, Family History, Medications, and allergies have been reviewed in [facility], and have been updated if relevant.  Review of Systems  Constitutional: Negative for fever and fatigue.  HENT: Positive for postnasal drip and rhinorrhea. Negative for sinus pressure, sneezing, sore throat, tinnitus, trouble swallowing and voice change.   Eyes: Negative.   Respiratory: Positive for cough. Negative for shortness of breath, wheezing and stridor.   Cardiovascular: Negative.   Gastrointestinal: Negative for nausea, vomiting and diarrhea.  Endocrine: Negative.   Genitourinary: Negative.   Musculoskeletal: Negative.   Skin: Negative.   Allergic/Immunologic: Negative.   Neurological: Negative.   Hematological: Negative.   Psychiatric/Behavioral: Negative.   All other systems reviewed and are negative.      Objective:    BP 130/90 mmHg  Pulse 76  Temp(Src) 97.9 F (36.6 C) (Tympanic)  Wt 219 lb (99.338 kg)  SpO2 98%  Wt Readings  from Last 3 Encounters:  11/24/14 219 lb (99.338 kg)  07/20/14 212 lb 8 oz (96.389 kg)  11/11/13 198 lb 8 oz (90.039 kg)     Physical Exam  Constitutional: He is oriented to person, place, and time. He appears well-developed and well-nourished. No distress.  HENT:  Right Ear: Hearing and tympanic membrane normal.  Left Ear: Hearing and tympanic membrane normal.  Nose: Rhinorrhea present. No sinus tenderness. Right sinus exhibits no maxillary sinus tenderness and no frontal sinus tenderness. Left sinus exhibits no maxillary sinus tenderness and no frontal sinus tenderness.  Eyes: Conjunctivae are normal.  Cardiovascular: Normal rate, regular rhythm and normal heart sounds.   Pulmonary/Chest: Effort normal and breath sounds normal. No respiratory distress. He has no wheezes. He exhibits no tenderness.  Abdominal: Soft.  Musculoskeletal: Normal range of motion.  Neurological: He is alert and oriented to person, place, and time. No cranial nerve deficit.  Skin: Skin is warm and dry.  Psychiatric: He has a normal mood and affect. His behavior is normal. Judgment and thought content normal.  Nursing note and vitals reviewed.         Assessment & Plan:   Cough - Plan: DG Chest 2 View  Gastroesophageal reflux disease, esophagitis presence not specified  Left arm pain  HLD (hyperlipidemia) - Plan: Lipid panel, Comprehensive metabolic panel, CBC with Differential  Essential hypertension  Screening for prostate cancer - Plan: PSA No Follow-up on file.

## 2014-11-24 NOTE — Patient Instructions (Signed)
Good to see you. We will call you with your xray and labs results.

## 2014-11-24 NOTE — Assessment & Plan Note (Signed)
Continue crestor, recheck labs today.  Orders Placed This Encounter  Procedures  . DG Chest 2 View  . Lipid panel  . Comprehensive metabolic panel  . PSA  . CBC with Differential

## 2014-11-24 NOTE — Assessment & Plan Note (Signed)
Deteriorated. See above. D/c prilosec, start omeprazole. He will update me in 2 weeks.

## 2014-11-24 NOTE — Addendum Note (Signed)
Addended by: Marchia Bond on: 11/24/2014 09:06 AM   Modules accepted: Orders

## 2014-11-24 NOTE — Progress Notes (Signed)
Pre visit review using our clinic review tool, if applicable. No additional management support is needed unless otherwise documented below in the visit note. 

## 2014-11-24 NOTE — Assessment & Plan Note (Signed)
Persistent and does not seem infectious. Wide differential- discussed this with pt- ? Reflux vs PND. Will d/c omeprazole, start protonix 40 mg daily.  Discussed seeing GI for endoscopy- he would like to defer this.  Also get a CXR today. Less likely due to ACEI given description of cough. The patient indicates understanding of these issues and agrees with the plan.

## 2014-11-25 ENCOUNTER — Telehealth: Payer: Self-pay | Admitting: *Deleted

## 2014-11-25 LAB — LIPID PANEL
CHOLESTEROL TOTAL: 328 mg/dL — AB (ref 100–199)
Chol/HDL Ratio: 9.9 ratio units — ABNORMAL HIGH (ref 0.0–5.0)
HDL: 33 mg/dL — AB (ref >39)
LDL Calculated: 241 mg/dL — ABNORMAL HIGH (ref 0–99)
Triglycerides: 269 mg/dL — ABNORMAL HIGH (ref 0–149)
VLDL Cholesterol Cal: 54 mg/dL — ABNORMAL HIGH (ref 5–40)

## 2014-11-25 LAB — CBC WITH DIFFERENTIAL/PLATELET
Basophils Absolute: 0.1 10*3/uL (ref 0.0–0.2)
Basos: 1 %
EOS ABS: 0.4 10*3/uL (ref 0.0–0.4)
EOS: 5 %
HCT: 42.4 % (ref 37.5–51.0)
Hemoglobin: 14.4 g/dL (ref 12.6–17.7)
IMMATURE GRANULOCYTES: 0 %
Immature Grans (Abs): 0 10*3/uL (ref 0.0–0.1)
Lymphocytes Absolute: 3 10*3/uL (ref 0.7–3.1)
Lymphs: 39 %
MCH: 28.3 pg (ref 26.6–33.0)
MCHC: 34 g/dL (ref 31.5–35.7)
MCV: 83 fL (ref 79–97)
MONOCYTES: 7 %
MONOS ABS: 0.6 10*3/uL (ref 0.1–0.9)
NEUTROS PCT: 48 %
Neutrophils Absolute: 3.8 10*3/uL (ref 1.4–7.0)
RBC: 5.09 x10E6/uL (ref 4.14–5.80)
RDW: 14.2 % (ref 12.3–15.4)
WBC: 7.8 10*3/uL (ref 3.4–10.8)

## 2014-11-25 LAB — COMPREHENSIVE METABOLIC PANEL
A/G RATIO: 1.9 (ref 1.1–2.5)
ALBUMIN: 4.5 g/dL (ref 3.5–5.5)
ALT: 48 IU/L — ABNORMAL HIGH (ref 0–44)
AST: 44 IU/L — ABNORMAL HIGH (ref 0–40)
Alkaline Phosphatase: 75 IU/L (ref 39–117)
BILIRUBIN TOTAL: 0.2 mg/dL (ref 0.0–1.2)
BUN/Creatinine Ratio: 20 (ref 9–20)
BUN: 21 mg/dL (ref 6–24)
CALCIUM: 9.6 mg/dL (ref 8.7–10.2)
CO2: 24 mmol/L (ref 18–29)
Chloride: 101 mmol/L (ref 97–108)
Creatinine, Ser: 1.04 mg/dL (ref 0.76–1.27)
GFR calc Af Amer: 98 mL/min/{1.73_m2} (ref >59)
GFR calc non Af Amer: 85 mL/min/{1.73_m2} (ref >59)
Globulin, Total: 2.4 g/dL (ref 1.5–4.5)
Glucose: 99 mg/dL (ref 65–99)
Potassium: 4.5 mmol/L (ref 3.5–5.2)
SODIUM: 142 mmol/L (ref 134–144)
Total Protein: 6.9 g/dL (ref 6.0–8.5)

## 2014-11-25 LAB — PSA: PSA: 1.9 ng/mL (ref 0.0–4.0)

## 2014-11-25 MED ORDER — AZITHROMYCIN 250 MG PO TABS: ORAL_TABLET | ORAL | Status: DC

## 2014-11-25 NOTE — Telephone Encounter (Signed)
Pt came into office and states that his abx was sent to mail order pharmacy. Encounter opened and Rx sent to requested local pharmacy

## 2014-11-25 NOTE — Addendum Note (Signed)
Addended by: Modena Nunnery on: 11/25/2014 07:42 AM   Modules accepted: Orders

## 2014-11-29 ENCOUNTER — Other Ambulatory Visit: Payer: Self-pay | Admitting: Family Medicine

## 2014-11-29 DIAGNOSIS — E785 Hyperlipidemia, unspecified: Secondary | ICD-10-CM

## 2014-12-03 ENCOUNTER — Encounter: Payer: Self-pay | Admitting: *Deleted

## 2014-12-14 ENCOUNTER — Ambulatory Visit (INDEPENDENT_AMBULATORY_CARE_PROVIDER_SITE_OTHER): Payer: PRIVATE HEALTH INSURANCE | Admitting: Pharmacist

## 2014-12-14 DIAGNOSIS — E785 Hyperlipidemia, unspecified: Secondary | ICD-10-CM | POA: Diagnosis not present

## 2014-12-14 NOTE — Patient Instructions (Signed)
It was great to meet you today!  Continue to take your Crestor 40 mg every day and try to increase your exercise regimen. Also try to increase the fiber in your diet (vegetables and nuts) as well as eating more lean proteins like chicken, fish and [place].  Please get your cholesterol checked in about 3 months time and we will see you back in our clinic on 5/10 at 8:30 am.

## 2014-12-14 NOTE — Progress Notes (Signed)
[name] is a pleasant 49 yo M referred by Dr. Deborra Medina for lipid management. PMH is significant for HTN and HLD.   In the past, he has attempted use with Lipitor a few times a week and Zetia but this was cost limiting. He was transitioned to Crestor 40 daily in September 2015 and has been doing well with ~22% reduction in LDL without any perceived side effects. He does state that his copay is very high (~$200/90 days). 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 His physical exam findings are currently negative for arcus cornealis and tendon xanthomas. He does state that he has had a history of xanthelasmas but currently does not appear to have any.   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 Bupropion Hcl     REACTION: anxiety  . Fluoxetine Hcl     REACTION: more anxious and tired   Current Outpatient Prescriptions  Medication Sig Dispense Refill  . busPIRone (BUSPAR) 10 MG tablet Take 15 mg by mouth daily.    . CRESTOR 40 MG tablet TAKE 1 TABLET DAILY 90 tablet 1  . lisinopril-hydrochlorothiazide (PRINZIDE,ZESTORETIC) 10-12.5 MG per tablet Take 1 tablet by mouth daily. 90 tablet 1  . pantoprazole (PROTONIX) 40 MG tablet Take 1 tablet (40 mg total) by mouth daily. (Patient taking differently: Take 40 mg by mouth 2 (two) times daily. ) 90 tablet 3  . FLUoxetine (PROZAC) 10 MG capsule Take 40 mg by mouth daily.      . methylphenidate (RITALIN) 20 MG tablet Take 20 mg by mouth daily.     No current facility-administered medications for this visit.    A/P:  Based on Mr. Thall most recent lipid panel, he remains above goal LDL < 100 and non-HDL < 130 despite 4 months of therapy with Crestor 40 mg daily. We discussed the benefits of concomitant Zetia use with his Crestor as well as consideration of use with PCSK9 inhibitor if he meets criteria for familial hypercholesterolemia. He was very hesitant to add on another medication to his regimen at this time and stated that he would like to attempt diet and exercise along with continued use of his Crestor. We provided him with a copay assistance card ($3/90 day) for his Crestor and told him that we could also provide him with an assistance card for Zetia if needed in the future. He was able to identify better eating habits including increasing lean proteins and vegetables and also stated that he would attempt increasing his exercise habits. He did not seem very enthusiastic about his ability to make these lifestyle changes but was willing to try if it meant that he didn't have to add on another medication. We will reassess another fasting lipid panel in 3 months and readdress options at that time.   [name]. [name], PharmD Clinical Pharmacist - Resident Pager: [phone] Pharmacy:  [phone] 12/14/2014 11:53 AM

## 2014-12-15 NOTE — Progress Notes (Signed)
Discussed finding with patient.  Agree with above assessment.

## 2015-02-25 NOTE — Op Note (Signed)
PATIENT NAME:  [name], SPACE MR#:  [id number] DATE OF BIRTH:  [birth year]  DATE OF PROCEDURE:  12/01/2012  PREOPERATIVE DIAGNOSES: 1. Nasal obstruction.  2. Septal deviation.  3. Bilateral inferior turbinate hypertrophy.  4. 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  5. 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   POSTOPERATIVE DIAGNOSES: 1. Nasal obstruction.  2. Septal deviation.  3. Bilateral inferior turbinate hypertrophy.  4. 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  5. 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   PROCEDURES PERFORMED: 1. Right frontal sinusotomy.  2. Left frontal sinusotomy.  3. Left anterior ethmoidectomy.  4. Left maxillary antrostomy.  5. Right-sided concha bullosa.  6. Septoplasty.  7. Bilateral inferior turbinate reduction via resection of tissue.  8. Image-guided sinus surgery.   SURGEON: Jerene Bears, M.D.   ANESTHESIA: General endotracheal.  SPECIMEN: Right and left sinus contents.   INDICATIONS FOR PROCEDURE: The patient is a 49 year old male with history of chronic rhinosinusitis and found on CT scan to have severe septal deviation, bilateral inferior turbinate hypertrophy, left maxillary, ethmoid and bilateral frontal sinusitis as well as nasal obstruction and right-sided concha bullosa.   OPERATIVE FINDINGS: Severe left-sided septal deviation with septum off of the maxillary crest, even anteriorly. Right-sided concha bullosa. Left-sided crowding of the left ostiomeatal complex. Left anterior ethmoid mucosal erythema. Bilateral stenotic frontal sinus outflow tracts with agger nasi air cells.   DESCRIPTION OF PROCEDURE: After the patient was identified in holding and benefits and risks of the procedure were discussed and consent was reviewed, the patient was taken to the operating room and placed in the supine position. General endotracheal anesthesia was induced. The patient  was rotated 90 degrees. 6 mL of 1% lidocaine with 1:100,000 units of epinephrine were injected into the patient's anterior septum bilaterally and anterior/inferior turbinates bilaterally and the image-guided Stryker system was set up and calibrated in normal fashion with appropriate error of 0.7 mm and calibration and the image guidance sinus system was referred to throughout the duration of the case to ensure proper positioning and aid in opening frontal sinuses.   At this time, the patient was prepped and draped in a sterile fashion. A 0 degree endoscope was brought into the field and visualization of the patient's sinuses was made. The middle turbinate was infractured bilaterally. The uncinate process was infractured on the left and a ball-tip probe was used to palpate the frontal sinus outflow tract, on the left. Balloon Entellus system was placed into the left frontal sinus outflow tract and after proper transillumination was achieved, the frontal sinus outflow tract was dilated multiple times with the balloon. Care was taken to avoid any injury and care was taken to ensure proper positioning of the balloon. The crushed agger nasi of the anterior ethmoid cells was removed with a 45 degree up-biting forceps and the remaining tissue was also debrided from the left frontal sinus outflow tract. Care was taken to avoid circumferential scarring. At this time, attention was directed to the patient's right frontal sinus. In a similar fashion, the middle turbinate with concha bullosa was infractured and the Entellus balloon system was placed behind the uncinate process. There was large agger nasi cell here. This was crushed anteriorly and then the posterior frontal sinus outflow tract was accessible with the [place] system. The Entellus balloon system was then placed through the right frontal sinus outflow tract and transillumination of the frontal sinus was achieved. The balloon was advanced and was dilated multiple  times. Following dilation a 30 degree scope was  brought onto the field and visualization of the frontal sinus outflow tract was made. Crushed pieces of agger nasi cell were removed with a 45 degree biting forceps and the frontal sinus outflow tract was probed with a ball-tipped probe and noted to be widely patent.   At this time, attention was directed to the patient's septoplasty. A hemitransfixion incision was made along the junction of the left squamous mucosal border and mucoperichondrial flap was elevated on the left side of the septum. This was taken posteriorly until the anterior edge of the septum was encountered. This was separated from the maxillary crest. Contralateral mucoperichondrial flap was then elevated for mobilization of the entire anterior septum. A pocket was made in the columella for placement of the anterior septum. The mucoperichondrial flap was taken posteriorly past the bony cartilaginous junction. Severe septal deviation of a combination of bony and cartilaginous deviation was encountered. A transcartilaginous incision was made using a freer elevator. The contralateral mucoperichondrial flap was elevated posterior to this point. Care was taken to ensure a L-shaped strut with at least greater than 1 cm anteriorly and dorsally. At this time, the bony and cartilaginous deviation was removed with a combination of straight through-cutting forceps as well as the freer elevator, as it was taken off the maxillary crest, which resulted in significant improvement. Care was taken to avoid contralateral perforations. A small perforation was placed on the left mid septum for a drain hole. Using image-guidance system, all bony and cartilaginous deviation was ensured to be removed. At this time, attention was directed to the patient's inferior turbinates.   A freer elevator was used to infracture the inferior turbinates bilaterally and a Kelly clamp was attached to the anterior/inferior third of  each inferior turbinate for approximately 1 minute and under endoscopic visualization using the 0 degree scope the inferior turbinate was trimmed and the cut edge was cauterized with Bovie electrocautery and then bilateral inferior turbinates were outfractured using a Soil scientist.   At this time, attention was directed to the patient's right-sided concha bullosa. Under direct visualization using a 0 degree endoscope, the patient's right-sided concha bullosa was visualized. A Gruenwald through-cut was used to remove the inferior third of the right middle turbinate. This demonstrated and a wide opening of the patient's right ostiomeatal complex. Hemostasis was achieved with Bovie suction cautery and attention was directed to the patient's left maxillary antrostomy. The uncinate process was infractured with a ball-tipped probe. The natural ostium was entered with a ball tip probe on the left side. Under endoscopic visualization, the Diego microdebrider was brought onto the field and the remaining uncinate process as well as the maxillary antrostomy was widely opened. The maxillary antrostomy revealed some moderate mucosal edema and inflammation. Attention at this time was directed to the patient's anterior ethmoids. Under endoscopic visualization, the patient's anterior ethmoids were opened in a medial and inferior fashion using the straight image guidance suction. These were continuously crushed more posteriorly from a medial and inferior position to superior and lateral. Pieces were removed with straight biting forceps. Hemostasis was achieved intermittently with Afrin-soaked pledgets. At this time, the patient's nasal cavities were copiously irrigated with sterile saline. The septoplasty hemitransfixion incision was reopened and the anterior septum was placed into the pocket and columella and kept in place with a Monocryl suture pulling the displaced septum to the patient's right side and reducing the  left-sided septal deviation anteriorly. At this time, the mucoperichondrial  flap was closed with interrupted chromic suture. Stammberger Sinu-Foam was placed along the cut edge of the inferior turbinates as well as along the left maxillary antrostomy. Nasopore foam was placed lateral to the middle turbinates bilaterally and bilateral large septal splints were placed into the patient's nasal cavity and held in place with a single nylon suture through the patient's septum. At this time, care of the patient was transferred to anesthesia. The patient tolerated the procedure well. ____________________________ Jerene Bears, MD ccv:sb D: 12/01/2012 11:52:19 ET T: 12/01/2012 12:55:02 ET JOB#: [id number]  cc: Jerene Bears, MD, <Dictator> Jerene Bears MD ELECTRONICALLY SIGNED 12/02/2012 12:46

## 2015-03-15 ENCOUNTER — Ambulatory Visit: Payer: PRIVATE HEALTH INSURANCE | Admitting: Pharmacist

## 2015-03-21 ENCOUNTER — Telehealth: Payer: Self-pay | Admitting: Family Medicine

## 2015-03-21 NOTE — Telephone Encounter (Signed)
Pt needs his Crestor refilled.  He would like 90 days called into [facility] on [address] (he does not want to use the mail order [place]).  [phone]

## 2015-03-21 NOTE — Telephone Encounter (Signed)
Please see note from 12/14/14- he is being managed by lipid clinic and needs to request rx from them.

## 2015-03-22 NOTE — Telephone Encounter (Signed)
[name], Please call this patient.  I am not in the office. Thank you!

## 2015-03-22 NOTE — Telephone Encounter (Signed)
Lm on pts vm and advised him per Dr Deborra Medina. Pt advised to contact office should she have any additional questions

## 2015-03-24 ENCOUNTER — Other Ambulatory Visit: Payer: Self-pay

## 2015-03-24 MED ORDER — PANTOPRAZOLE SODIUM 40 MG PO TBEC: 40.0000 mg | DELAYED_RELEASE_TABLET | Freq: Every day | ORAL | Status: DC

## 2015-03-24 NOTE — Telephone Encounter (Signed)
Pt left v/m; has been taking pantoprazole 40 mg twice a day and [facility].Please advise. Have not changed med list instructions until approved by provider.

## 2015-03-25 NOTE — Telephone Encounter (Signed)
Are you denying pt being able to take 40mg  BID? Please advise as pt may ask

## 2015-03-25 NOTE — Telephone Encounter (Signed)
Pt left v/m if protonix rx for taking med twice a day is going to be filled.

## 2015-03-27 MED ORDER — PANTOPRAZOLE SODIUM 40 MG PO TBEC: 40.0000 mg | DELAYED_RELEASE_TABLET | Freq: Two times a day (BID) | ORAL | Status: DC

## 2015-03-27 NOTE — Addendum Note (Signed)
Addended by: Jearld Fenton on: 03/27/2015 09:39 AM   Modules accepted: Orders

## 2015-03-27 NOTE — Telephone Encounter (Signed)
Ok to send RX for BID, just need to change instructions.

## 2015-04-21 IMAGING — CR DG CHEST 2V
2 series · 2 of 2 positions shown · non-contrast
Comparison: 11/24/2009

CLINICAL DATA: cough x 3-4 months

EXAM:
CHEST  2 VIEW

[view not recorded (1 of 2)]
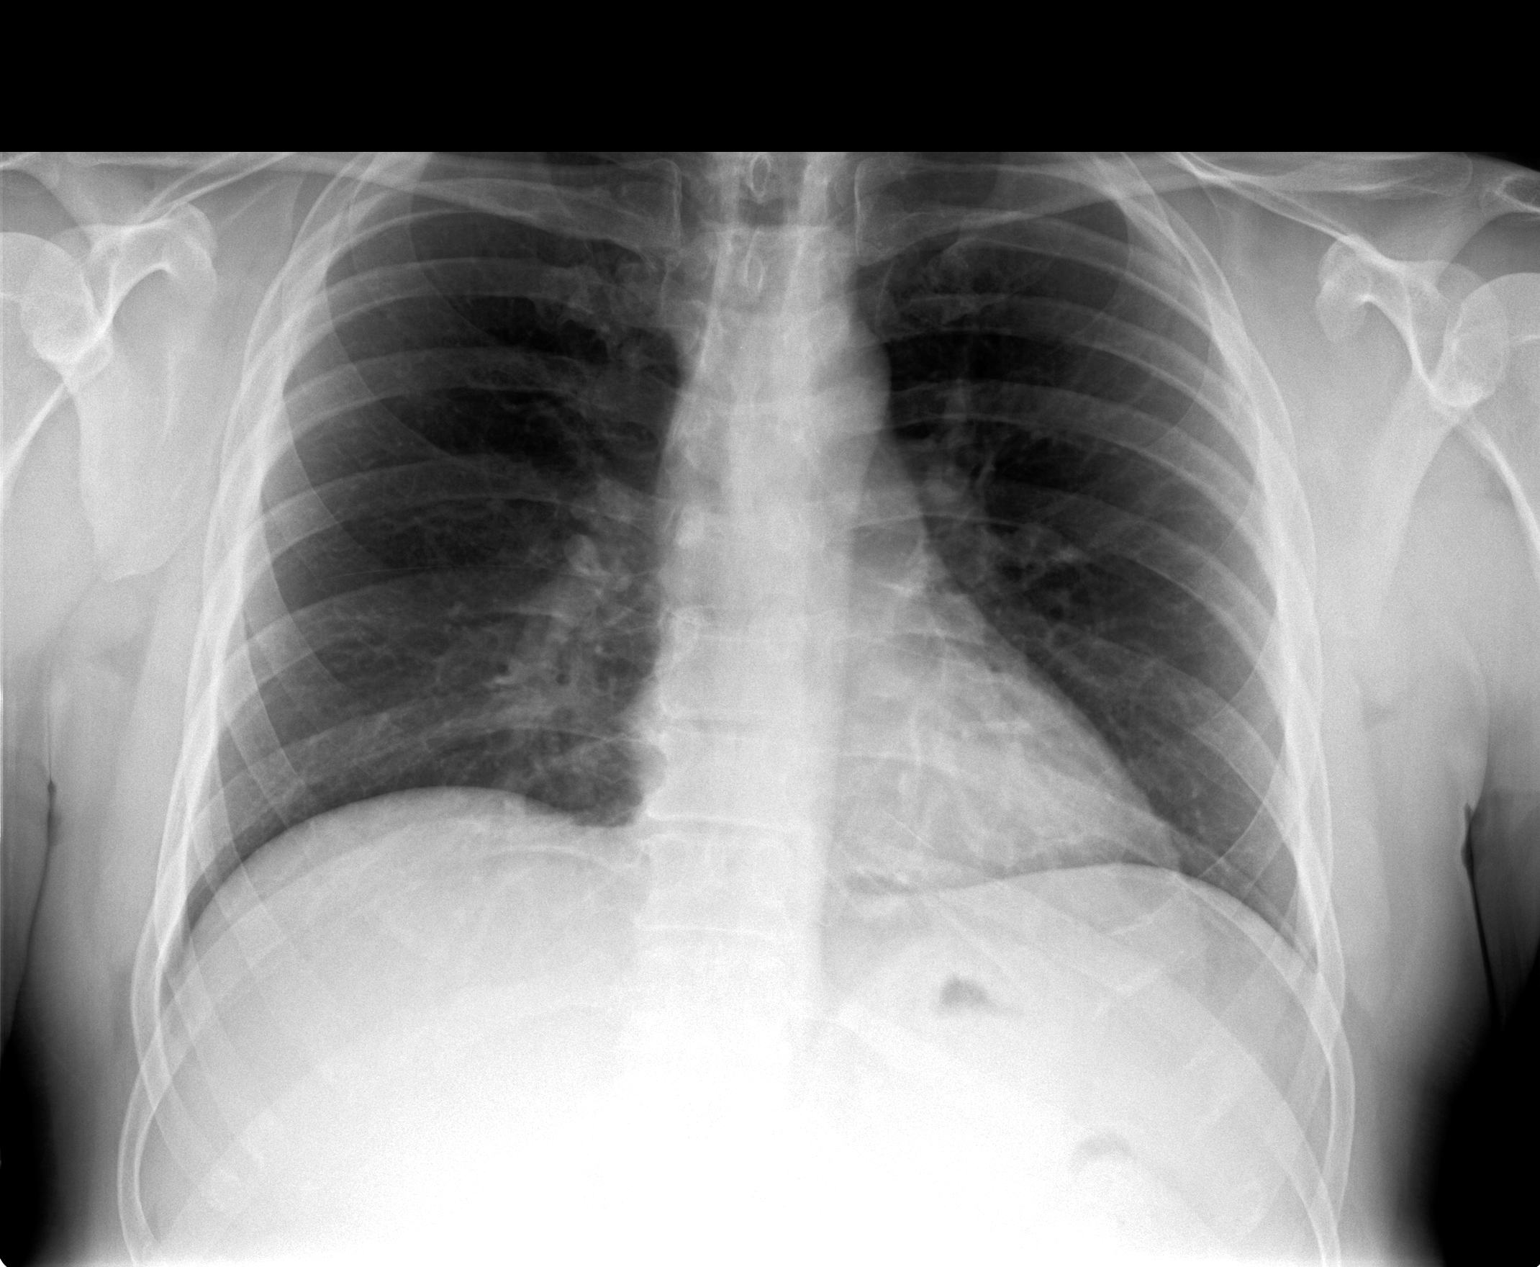

[view not recorded (2 of 2)]
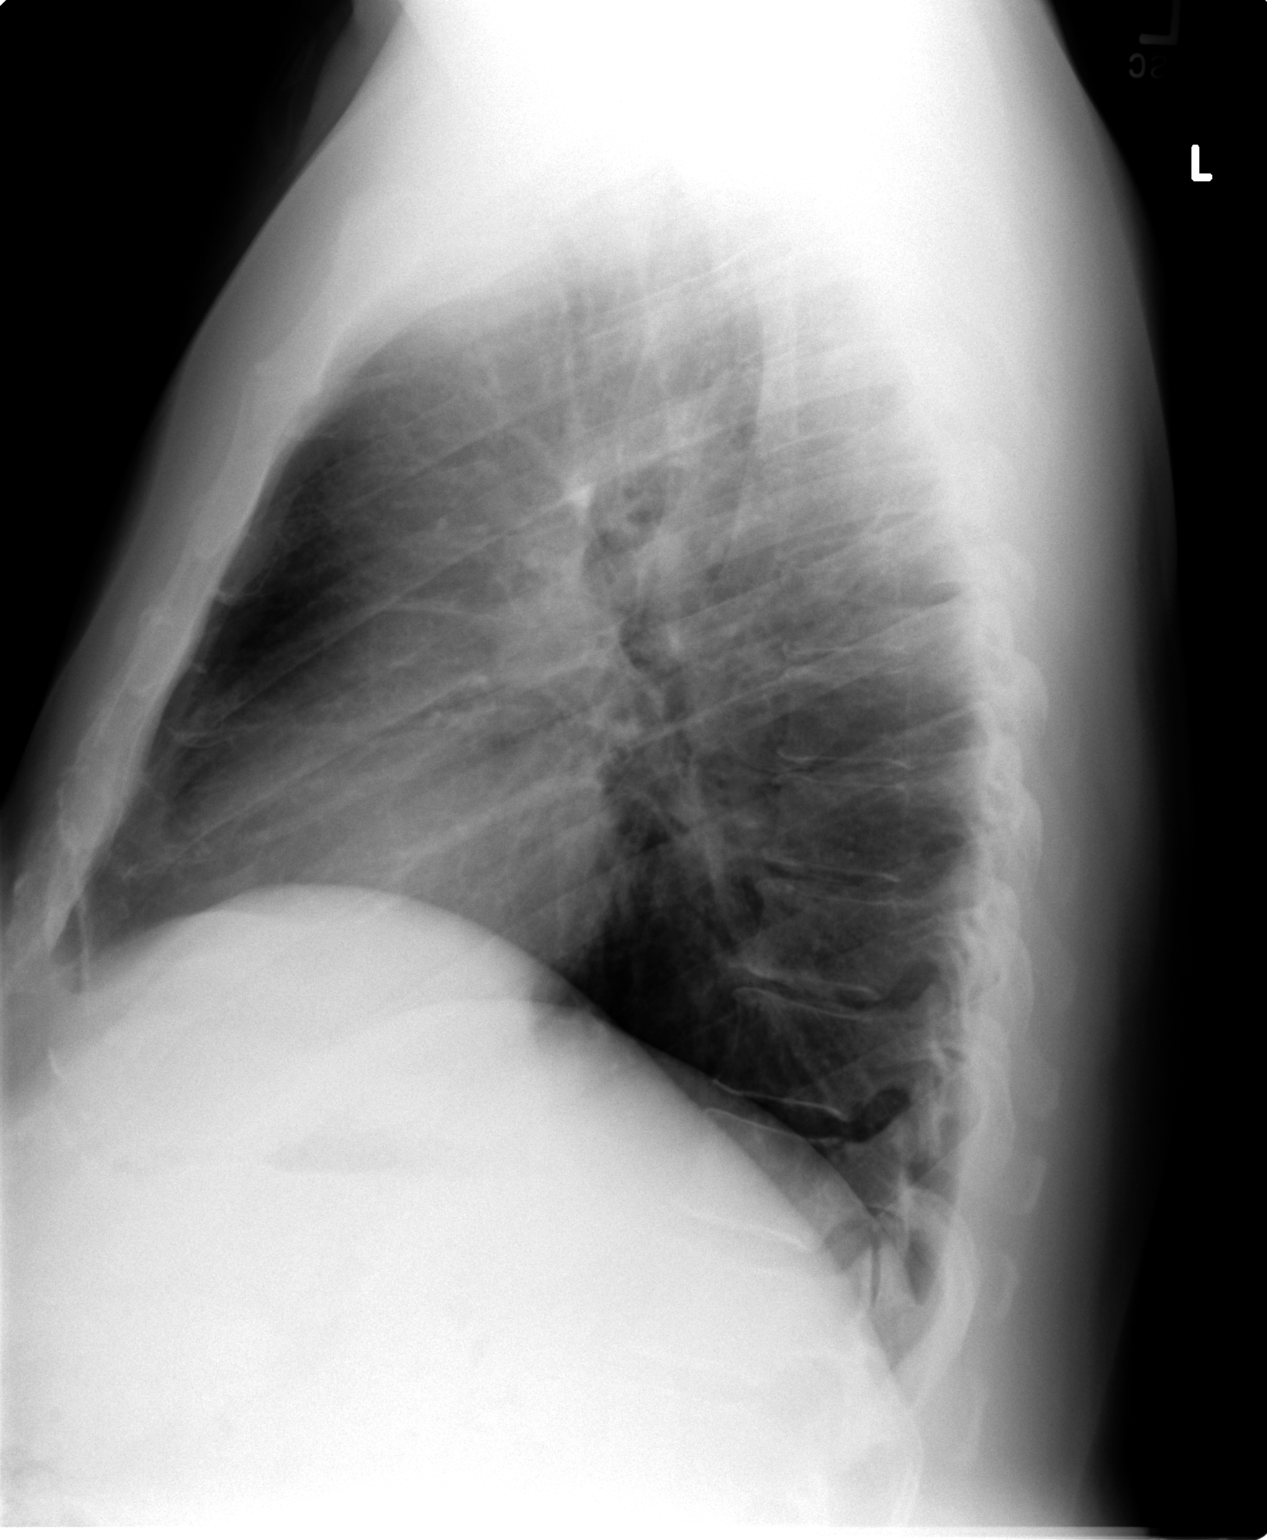

[2 of 2 positions shown; findings below may reference images not displayed]

FINDINGS: Grossly unchanged cardiac silhouette and mediastinal contours.
Overall improved aeration of the lungs with persistent mild
perihilar predominant peribronchial coughing. No discrete focal
airspace opacities. No pleural effusion or pneumothorax. No evidence
of edema. No acute osseus abnormalities.
IMPRESSION: Findings suggestive of mild airways disease / bronchitis. No focal
airspace opacities to suggest pneumonia.

## 2015-07-01 ENCOUNTER — Other Ambulatory Visit: Payer: Self-pay | Admitting: Family Medicine

## 2015-07-03 ENCOUNTER — Other Ambulatory Visit: Payer: Self-pay | Admitting: Family Medicine

## 2015-07-18 ENCOUNTER — Encounter: Payer: Self-pay | Admitting: Family Medicine

## 2015-07-18 ENCOUNTER — Ambulatory Visit (INDEPENDENT_AMBULATORY_CARE_PROVIDER_SITE_OTHER): Payer: PRIVATE HEALTH INSURANCE | Admitting: Family Medicine

## 2015-07-18 ENCOUNTER — Ambulatory Visit: Payer: PRIVATE HEALTH INSURANCE | Admitting: Family Medicine

## 2015-07-18 VITALS — BP 118/78 | HR 76 | Temp 98.1°F | Wt 216.0 lb

## 2015-07-18 DIAGNOSIS — E785 Hyperlipidemia, unspecified: Secondary | ICD-10-CM | POA: Diagnosis not present

## 2015-07-18 DIAGNOSIS — I10 Essential (primary) hypertension: Secondary | ICD-10-CM | POA: Diagnosis not present

## 2015-07-18 NOTE — Patient Instructions (Signed)
Great to see you. We will call you with your lab results and you can view them online.  

## 2015-07-18 NOTE — Addendum Note (Signed)
Addended by: Ellamae Sia on: 07/18/2015 02:55 PM   Modules accepted: Orders

## 2015-07-18 NOTE — Progress Notes (Signed)
Pre visit review using our clinic review tool, if applicable. No additional management support is needed unless otherwise documented below in the visit note. 

## 2015-07-18 NOTE — Assessment & Plan Note (Signed)
Normotensive. No changes made to rxs today. 

## 2015-07-18 NOTE — Assessment & Plan Note (Signed)
Continue current dose of Crestor. Check labs today. Orders Placed This Encounter  Procedures  . Comprehensive metabolic panel  . Lipid panel   The patient indicates understanding of these issues and agrees with the plan.

## 2015-07-18 NOTE — Progress Notes (Signed)
Subjective:   Patient ID: [name], male    DOB: February 26, 1966, 49 y.o.   MRN: [id number]  [name] is a pleasant 49 y.o. year old male who presents to clinic today with Follow-up  on 07/18/2015  HPI: HLD- followed by lipid clinic.  Last seen on 12/14/14- note reviewed.  Advised Crestor 40 mg daily in addition to Zetia and follow up in 3 months.  He was lost to follow up and unfortunately is only taking Crestor, refused to add a second agent. Lab Results  Component Value Date   CHOL 328* 11/24/2014   HDL 33* 11/24/2014   LDLCALC 241* 11/24/2014   LDLDIRECT 309.7 07/20/2014   TRIG 269* 11/24/2014   CHOLHDL 9.9* 11/24/2014   HTN- has been well controlled on Prinzide 10-12.5 mg daily.  Denies any HA, blurred vision, CP or SOB. No LE edema. Lab Results  Component Value Date   CREATININE 1.04 11/24/2014    Current Outpatient Prescriptions on File Prior to Visit  Medication Sig Dispense Refill  . busPIRone (BUSPAR) 10 MG tablet Take 15 mg by mouth daily.    Marland Kitchen lisinopril-hydrochlorothiazide (PRINZIDE,ZESTORETIC) 10-12.5 MG per tablet Take 1 tablet by mouth daily. 90 tablet 1  . omeprazole (PRILOSEC) 40 MG capsule TAKE 1 CAPSULE DAILY 90 capsule 0  . rosuvastatin (CRESTOR) 40 MG tablet TAKE 1 TABLET DAILY 30 tablet 0  . FLUoxetine (PROZAC) 10 MG capsule Take 40 mg by mouth daily.     . methylphenidate (RITALIN) 20 MG tablet Take 20 mg by mouth daily.    . pantoprazole (PROTONIX) 40 MG tablet Take 1 tablet (40 mg total) by mouth 2 (two) times daily. (Patient not taking: Reported on 07/18/2015) 180 tablet 1   No current facility-administered medications on file prior to visit.    Allergies  Allergen Reactions  . Bupropion Hcl     REACTION: anxiety  . Fluoxetine Hcl     REACTION: more anxious and tired    Past Medical History  Diagnosis Date  . Anxiety   . Hyperlipidemia   . Allergy   . GERD (gastroesophageal reflux disease)   . Depression   . ADHD (attention deficit  hyperactivity disorder)     Past Surgical History  Procedure Laterality Date  . Spine surgery  1999    Lumbar   . Cholesteatoma excision  1993    left  . Varicocele excision  12/2005    Family History  Problem Relation Age of Onset  . Hyperlipidemia Mother   . Heart attack Mother 80  . Hypertension Brother     Social History   Social History  . Marital Status: Married    Spouse Name: N/A  . Number of Children: N/A  . 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 Smoking status: Never Smoker   . Smokeless tobacco: Not on file  . Alcohol Use: No  . Drug Use: No  . Sexual Activity: Not on file   Other Topics Concern  . Not on file   Social History Narrative   The PMH, PSH, Social History, Family History, Medications, and allergies have been reviewed in [facility], and have been updated if relevant.   Review of Systems  Constitutional: Negative.   Eyes: Negative.   Respiratory: Negative.  Negative for apnea.   Cardiovascular: Negative.   Endocrine: Negative.   Genitourinary: Negative.   Musculoskeletal: Negative.  Skin: Negative.   Allergic/Immunologic: Negative.   Neurological: Negative.   Hematological: Negative.   Psychiatric/Behavioral: Negative.   All other systems reviewed and are negative.   Objective:    BP 118/78 mmHg  Pulse 76  Temp(Src) 98.1 F (36.7 C) (Oral)  Wt 216 lb (97.977 kg)  SpO2 98%  Wt Readings from Last 3 Encounters:  07/18/15 216 lb (97.977 kg)  11/24/14 219 lb (99.338 kg)  07/20/14 212 lb 8 oz (96.389 kg)     Physical Exam  Constitutional: He appears well-developed and well-nourished. No distress.  HENT:  Head: Normocephalic.  Cardiovascular: Normal rate and regular rhythm.   Pulmonary/Chest: Effort normal and breath sounds normal. No respiratory distress. He has no wheezes.  Musculoskeletal: He exhibits no edema.  Psychiatric: He has a normal mood and  affect. His behavior is normal. Judgment and thought content normal.  Nursing note and vitals reviewed.         Assessment & Plan:   HLD (hyperlipidemia) - Plan: Comprehensive metabolic panel, Lipid panel  Essential hypertension No Follow-up on file.

## 2015-07-19 LAB — COMPREHENSIVE METABOLIC PANEL
ALBUMIN: 4.8 g/dL (ref 3.5–5.5)
ALT: 32 IU/L (ref 0–44)
AST: 21 IU/L (ref 0–40)
Albumin/Globulin Ratio: 2.2 (ref 1.1–2.5)
Alkaline Phosphatase: 79 IU/L (ref 39–117)
BUN / CREAT RATIO: 14 (ref 9–20)
BUN: 17 mg/dL (ref 6–24)
Bilirubin Total: 0.4 mg/dL (ref 0.0–1.2)
CO2: 24 mmol/L (ref 18–29)
CREATININE: 1.18 mg/dL (ref 0.76–1.27)
Calcium: 9.8 mg/dL (ref 8.7–10.2)
Chloride: 101 mmol/L (ref 97–108)
GFR, EST AFRICAN AMERICAN: 84 mL/min/{1.73_m2} (ref >59)
GFR, EST NON AFRICAN AMERICAN: 73 mL/min/{1.73_m2} (ref >59)
GLOBULIN, TOTAL: 2.2 g/dL (ref 1.5–4.5)
GLUCOSE: 98 mg/dL (ref 65–99)
Potassium: 4.6 mmol/L (ref 3.5–5.2)
SODIUM: 141 mmol/L (ref 134–144)
TOTAL PROTEIN: 7 g/dL (ref 6.0–8.5)

## 2015-07-19 LAB — LIPID PANEL
CHOLESTEROL TOTAL: 277 mg/dL — AB (ref 100–199)
Chol/HDL Ratio: 7.5 ratio units — ABNORMAL HIGH (ref 0.0–5.0)
HDL: 37 mg/dL — ABNORMAL LOW (ref >39)
LDL CALC: 201 mg/dL — AB (ref 0–99)
TRIGLYCERIDES: 194 mg/dL — AB (ref 0–149)
VLDL Cholesterol Cal: 39 mg/dL (ref 5–40)

## 2015-07-22 ENCOUNTER — Encounter: Payer: Self-pay | Admitting: *Deleted

## 2015-08-27 ENCOUNTER — Other Ambulatory Visit: Payer: Self-pay | Admitting: Family Medicine

## 2015-12-21 ENCOUNTER — Other Ambulatory Visit: Payer: Self-pay | Admitting: *Deleted

## 2015-12-21 MED ORDER — ROSUVASTATIN CALCIUM 40 MG PO TABS: 40.0000 mg | ORAL_TABLET | Freq: Every day | ORAL | Status: DC

## 2015-12-21 NOTE — Telephone Encounter (Signed)
Spoke with patient and advised he need to be seen for further refills, pt scheduled appt

## 2016-01-02 ENCOUNTER — Ambulatory Visit (INDEPENDENT_AMBULATORY_CARE_PROVIDER_SITE_OTHER): Payer: PRIVATE HEALTH INSURANCE | Admitting: Family Medicine

## 2016-01-02 ENCOUNTER — Encounter: Payer: Self-pay | Admitting: Family Medicine

## 2016-01-02 VITALS — BP 148/98 | HR 112 | Temp 97.8°F | Wt 222.0 lb

## 2016-01-02 DIAGNOSIS — E785 Hyperlipidemia, unspecified: Secondary | ICD-10-CM

## 2016-01-02 DIAGNOSIS — I10 Essential (primary) hypertension: Secondary | ICD-10-CM | POA: Diagnosis not present

## 2016-01-02 MED ORDER — ROSUVASTATIN CALCIUM 40 MG PO TABS: 40.0000 mg | ORAL_TABLET | Freq: Every day | ORAL | Status: DC

## 2016-01-02 MED ORDER — EZETIMIBE 10 MG PO TABS: 10.0000 mg | ORAL_TABLET | Freq: Every day | ORAL | Status: DC

## 2016-01-02 NOTE — Patient Instructions (Addendum)
Great to see you. Please start Zetia right away along with your crestor.  Please schedule a lab visit on your way out.

## 2016-01-02 NOTE — Assessment & Plan Note (Signed)
eRx sent for zetia 10 mg daily, Continue Crestor 40 mg daily. Follow up lipid panel and CMET in 8 weeks- future orders entered.

## 2016-01-02 NOTE — Progress Notes (Signed)
Subjective:   Patient ID: [name], male    DOB: November 23, 1965, 50 y.o.   MRN: [id number]  [name] is a pleasant 50 y.o. year old male who presents to clinic today with Follow-up  on 01/02/2016  HPI: HLD- was followed by lipid clinic but has not been seen by them in over a year.  Last seen on 12/14/14- note reviewed.  Advised Crestor 40 mg daily in addition to Zetia and follow up in 3 months.  He was lost to follow up and unfortunately when I saw him in 07/2015, he only taking Crestor, refused to add a second agent.  When we checked his cholesterol with crestor only in 07/2015, lipids remained very high and I advised him to add Zetia.  He has been compliant with taking Crestor but never called the office back or responded to the letter we sent in order for [place] to send in rx.  He is willing to start it now.  Lab Results  Component Value Date   CHOL 277* 07/18/2015   HDL 37* 07/18/2015   LDLCALC 201* 07/18/2015   LDLDIRECT 309.7 07/20/2014   TRIG 194* 07/18/2015   CHOLHDL 7.5* 07/18/2015   HTN- has been well controlled on Prinzide 10-12.5 mg daily.  Denies any HA, blurred vision, CP or SOB. No LE edema. BP a little elevated today but he did rush to get here from work. BP Readings from Last 3 Encounters:  01/02/16 148/98  07/18/15 118/78  11/24/14 130/90    Lab Results  Component Value Date   CREATININE 1.18 07/18/2015    Current Outpatient Prescriptions on File Prior to Visit  Medication Sig Dispense Refill  . LamoTRIgine (LAMICTAL XR) 300 MG TB24 Take 300 mg by mouth daily.    Marland Kitchen lisinopril-hydrochlorothiazide (PRINZIDE,ZESTORETIC) 10-12.5 MG per tablet Take 1 tablet by mouth daily. 90 tablet 1  . rosuvastatin (CRESTOR) 40 MG tablet Take 1 tablet (40 mg total) by mouth daily. 30 tablet 0   No current facility-administered medications on file prior to visit.    Allergies  Allergen Reactions  . Bupropion Hcl     REACTION: anxiety  . Fluoxetine Hcl     REACTION:  more anxious and tired    Past Medical History  Diagnosis Date  . Anxiety   . Hyperlipidemia   . Allergy   . GERD (gastroesophageal reflux disease)   . Depression   . ADHD (attention deficit hyperactivity disorder)     Past Surgical History  Procedure Laterality Date  . Spine surgery  1999    Lumbar   . Cholesteatoma excision  1993    left  . Varicocele excision  12/2005    Family History  Problem Relation Age of Onset  . Hyperlipidemia Mother   . Heart attack Mother 15  . Hypertension Brother     Social History   Social History  . Marital Status: Married    Spouse Name: N/A  . Number of Children: N/A  . 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 Smoking status: Never Smoker   . Smokeless tobacco: Not on file  . Alcohol Use: No  . Drug Use: No  . Sexual Activity: Not on file   Other Topics Concern  . Not on file   Social History Narrative   The PMH, PSH, Social History, Family History, Medications, and allergies have been reviewed in [facility], and  have been updated if relevant.   Review of Systems  Constitutional: Negative.   Eyes: Negative.   Respiratory: Negative.  Negative for apnea.   Cardiovascular: Negative.   Endocrine: Negative.   Genitourinary: Negative.   Musculoskeletal: Negative.   Skin: Negative.   Allergic/Immunologic: Negative.   Neurological: Negative.   Hematological: Negative.   Psychiatric/Behavioral: Negative.   All other systems reviewed and are negative.   Objective:    BP 148/98 mmHg  Pulse 112  Temp(Src) 97.8 F (36.6 C) (Oral)  Wt 222 lb (100.699 kg)  SpO2 98%  Wt Readings from Last 3 Encounters:  01/02/16 222 lb (100.699 kg)  07/18/15 216 lb (97.977 kg)  11/24/14 219 lb (99.338 kg)     Physical Exam  Constitutional: He is oriented to person, place, and time. He appears well-developed and well-nourished. No distress.  HENT:  Head:  Normocephalic.  Cardiovascular: Normal rate and regular rhythm.   Pulmonary/Chest: Effort normal and breath sounds normal. No respiratory distress. He has no wheezes.  Musculoskeletal: Normal range of motion. He exhibits no edema.  Neurological: He is alert and oriented to person, place, and time. No cranial nerve deficit.  Skin: Skin is warm and dry.  Psychiatric: He has a normal mood and affect. His behavior is normal. Judgment and thought content normal.  Nursing note and vitals reviewed.         Assessment & Plan:   HLD (hyperlipidemia) - Plan: Comprehensive metabolic panel, Lipid panel  Essential hypertension No Follow-up on file.

## 2016-01-02 NOTE — Assessment & Plan Note (Signed)
Mildly elevated today but rushed to get here and asymptomatic. Also told me as he was leaving that he did not take his rxs today.  Advised to take them when he got home. Call or return to clinic prn if these symptoms worsen or fail to improve as anticipated. The patient indicates understanding of these issues and agrees with the plan.

## 2016-01-02 NOTE — Progress Notes (Signed)
Pre visit review using our clinic review tool, if applicable. No additional management support is needed unless otherwise documented below in the visit note. 

## 2016-02-08 ENCOUNTER — Telehealth: Payer: Self-pay

## 2016-02-08 NOTE — Telephone Encounter (Signed)
Pt left v/m ;has question about zetia rx. Left v/m requesting cb.

## 2016-02-10 ENCOUNTER — Other Ambulatory Visit: Payer: Self-pay | Admitting: *Deleted

## 2016-02-10 MED ORDER — EZETIMIBE 10 MG PO TABS: 10.0000 mg | ORAL_TABLET | Freq: Every day | ORAL | Status: DC

## 2016-02-23 NOTE — Telephone Encounter (Signed)
zetia refill done separate note on 02/10/16.

## 2016-02-29 ENCOUNTER — Other Ambulatory Visit: Payer: PRIVATE HEALTH INSURANCE

## 2016-03-01 ENCOUNTER — Other Ambulatory Visit (INDEPENDENT_AMBULATORY_CARE_PROVIDER_SITE_OTHER): Payer: PRIVATE HEALTH INSURANCE

## 2016-03-01 ENCOUNTER — Telehealth: Payer: Self-pay

## 2016-03-01 DIAGNOSIS — E785 Hyperlipidemia, unspecified: Secondary | ICD-10-CM

## 2016-03-01 MED ORDER — EZETIMIBE 10 MG PO TABS: 10.0000 mg | ORAL_TABLET | Freq: Every day | ORAL | Status: DC

## 2016-03-01 NOTE — Telephone Encounter (Signed)
Pt left note [facility]. Pt last seen 01/02/16. Refill done per protocol. Left v/m requesting pt to cb to notify pt refill was done.

## 2016-03-03 LAB — COMPREHENSIVE METABOLIC PANEL
ALT: 53 IU/L — AB (ref 0–44)
AST: 31 IU/L (ref 0–40)
Albumin/Globulin Ratio: 1.9 (ref 1.2–2.2)
Albumin: 4.8 g/dL (ref 3.5–5.5)
Alkaline Phosphatase: 82 IU/L (ref 39–117)
BUN/Creatinine Ratio: 19 (ref 9–20)
BUN: 21 mg/dL (ref 6–24)
Bilirubin Total: 0.5 mg/dL (ref 0.0–1.2)
CALCIUM: 9.7 mg/dL (ref 8.7–10.2)
CHLORIDE: 99 mmol/L (ref 96–106)
CO2: 22 mmol/L (ref 18–29)
Creatinine, Ser: 1.09 mg/dL (ref 0.76–1.27)
GFR calc non Af Amer: 80 mL/min/{1.73_m2} (ref >59)
GFR, EST AFRICAN AMERICAN: 92 mL/min/{1.73_m2} (ref >59)
Globulin, Total: 2.5 g/dL (ref 1.5–4.5)
Glucose: 89 mg/dL (ref 65–99)
POTASSIUM: 4.9 mmol/L (ref 3.5–5.2)
Sodium: 140 mmol/L (ref 134–144)
TOTAL PROTEIN: 7.3 g/dL (ref 6.0–8.5)

## 2016-03-03 LAB — LIPID PANEL
CHOL/HDL RATIO: 5.3 ratio — AB (ref 0.0–5.0)
CHOLESTEROL TOTAL: 196 mg/dL (ref 100–199)
HDL: 37 mg/dL — ABNORMAL LOW (ref >39)
LDL CALC: 125 mg/dL — AB (ref 0–99)
TRIGLYCERIDES: 169 mg/dL — AB (ref 0–149)
VLDL Cholesterol Cal: 34 mg/dL (ref 5–40)

## 2016-03-09 ENCOUNTER — Other Ambulatory Visit: Payer: Self-pay | Admitting: Family Medicine

## 2016-03-09 NOTE — Telephone Encounter (Signed)
Spoke with Lennette Bihari and he did receive med nothing further needed.

## 2016-06-03 ENCOUNTER — Encounter: Payer: Self-pay | Admitting: Family Medicine

## 2016-08-02 ENCOUNTER — Other Ambulatory Visit: Payer: Self-pay | Admitting: Internal Medicine

## 2016-08-03 NOTE — Telephone Encounter (Signed)
Last filled by [facility] 03/2015--last OV with you was 12/2015 for f/u--no upcoming appts scheduled--please advise if okay to refill

## 2017-02-26 ENCOUNTER — Other Ambulatory Visit: Payer: Self-pay | Admitting: Family Medicine

## 2017-04-06 ENCOUNTER — Telehealth: Payer: Self-pay | Admitting: Family Medicine

## 2017-04-16 NOTE — Telephone Encounter (Signed)
Pt left vm requesting [facility] Last refilled # 30 on 04/08/17. Pt needs to schedule appt for med refill f/u appt. Pt last seen 01/02/16. Left v/m requesting pt to cb.

## 2017-04-19 NOTE — Telephone Encounter (Signed)
Left v/m requesting pt to cb. 

## 2017-04-30 NOTE — Telephone Encounter (Signed)
Left v/m requesting cb.

## 2017-05-01 NOTE — Telephone Encounter (Signed)
Pt left v/m that he has appt in July and will address refill issue then; pt left v/m no cb needed.

## 2017-05-13 ENCOUNTER — Ambulatory Visit (INDEPENDENT_AMBULATORY_CARE_PROVIDER_SITE_OTHER): Payer: PRIVATE HEALTH INSURANCE | Admitting: Family Medicine

## 2017-05-13 ENCOUNTER — Encounter: Payer: Self-pay | Admitting: Family Medicine

## 2017-05-13 VITALS — BP 138/88 | HR 89 | Ht 70.0 in | Wt 210.0 lb

## 2017-05-13 DIAGNOSIS — I10 Essential (primary) hypertension: Secondary | ICD-10-CM

## 2017-05-13 DIAGNOSIS — Z0000 Encounter for general adult medical examination without abnormal findings: Secondary | ICD-10-CM | POA: Insufficient documentation

## 2017-05-13 DIAGNOSIS — F411 Generalized anxiety disorder: Secondary | ICD-10-CM | POA: Diagnosis not present

## 2017-05-13 DIAGNOSIS — K219 Gastro-esophageal reflux disease without esophagitis: Secondary | ICD-10-CM

## 2017-05-13 DIAGNOSIS — Z1211 Encounter for screening for malignant neoplasm of colon: Secondary | ICD-10-CM | POA: Diagnosis not present

## 2017-05-13 DIAGNOSIS — E785 Hyperlipidemia, unspecified: Secondary | ICD-10-CM

## 2017-05-13 MED ORDER — PANTOPRAZOLE SODIUM 40 MG PO TBEC: 40.0000 mg | DELAYED_RELEASE_TABLET | Freq: Two times a day (BID) | ORAL | 3 refills | Status: DC

## 2017-05-13 MED ORDER — EZETIMIBE 10 MG PO TABS: 10.0000 mg | ORAL_TABLET | Freq: Every day | ORAL | 3 refills | Status: DC

## 2017-05-13 MED ORDER — LISINOPRIL-HYDROCHLOROTHIAZIDE 10-12.5 MG PO TABS: 1.0000 | ORAL_TABLET | Freq: Every day | ORAL | 1 refills | Status: DC

## 2017-05-13 MED ORDER — ROSUVASTATIN CALCIUM 40 MG PO TABS: 40.0000 mg | ORAL_TABLET | Freq: Every day | ORAL | 3 refills | Status: DC

## 2017-05-13 NOTE — Patient Instructions (Addendum)
Great to see you. We will call you with your results and you can view them online.  We will also call with a GI appointment.

## 2017-05-13 NOTE — Addendum Note (Signed)
Addended by: Lucille Passy on: 05/13/2017 03:35 PM   Modules accepted: Orders

## 2017-05-13 NOTE — Progress Notes (Signed)
Subjective:   Patient ID: [name], male    DOB: 1965-12-14, 51 y.o.   MRN: [id number]  [name] is a pleasant 51 y.o. year old male who presents to clinic today with Annual Exam  on 05/13/2017  HPI:  Anxiety/depression- Has been managed by psychiatry.  Recently placed on Viibryd and feels it is working well without side effects.  Has never had a colonoscopy. Does take protonix for GERD.  HLD- had been better controlled once he restarted Crestor last year (in addition to zetia). Due for labs today.  Lab Results  Component Value Date   CHOL 196 03/01/2016   HDL 37 (L) 03/01/2016   LDLCALC 125 (H) 03/01/2016   LDLDIRECT 309.7 07/20/2014   TRIG 169 (H) 03/01/2016   CHOLHDL 5.3 (H) 03/01/2016   Lab Results  Component Value Date   ALT 53 (H) 03/01/2016   AST 31 03/01/2016   ALKPHOS 82 03/01/2016   BILITOT 0.5 03/01/2016   HTN- has been well controlled on Prinzide 10-12.5 mg daily.  Denies any HA, blurred vision, CP or SOB. No LE edema.   Current Outpatient Prescriptions on File Prior to Visit  Medication Sig Dispense Refill  . clonazePAM (KLONOPIN) 0.5 MG tablet Take 0.5 mg by mouth daily as needed. for anxiety  3  . ezetimibe (ZETIA) 10 MG tablet TAKE 1 TABLET DAILY 90 tablet 0  . LamoTRIgine (LAMICTAL XR) 300 MG TB24 Take 300 mg by mouth daily.    . pantoprazole (PROTONIX) 40 MG tablet TAKE 1 TABLET TWICE A DAY 30 tablet 0  . rosuvastatin (CRESTOR) 40 MG tablet TAKE 1 TABLET DAILY 90 tablet 0  . lisinopril-hydrochlorothiazide (PRINZIDE,ZESTORETIC) 10-12.5 MG per tablet Take 1 tablet by mouth daily. (Patient not taking: Reported on 05/13/2017) 90 tablet 1   No current facility-administered medications on file prior to visit.     Allergies  Allergen Reactions  . Bupropion Hcl     REACTION: anxiety  . Fluoxetine Hcl     REACTION: more anxious and tired    Past Medical History:  Diagnosis Date  . ADHD (attention deficit hyperactivity disorder)   . Allergy    . Anxiety   . Depression   . GERD (gastroesophageal reflux disease)   . Hyperlipidemia     Past Surgical History:  Procedure Laterality Date  . [place]   left  . SPINE SURGERY  1999   Lumbar   . VARICOCELE EXCISION  12/2005    Family History  Problem Relation Age of Onset  . Hyperlipidemia Mother   . Heart attack Mother 28  . Hypertension Brother     Social History   Social History  . Marital status: Married    Spouse name: N/A  . Number of children: N/A  . 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 Smoking status: Never Smoker  . Smokeless tobacco: Not on file  . Alcohol use No  . Drug use: No  . Sexual activity: Not on file   Other Topics Concern  . Not on file   Social History Narrative  . No narrative on file   The PMH, PSH, Social History, Family History, Medications, and allergies have been reviewed in [facility], and have been updated if relevant.   Review of Systems  Constitutional: Negative.   HENT: Negative.   Eyes: Negative.   Respiratory: Negative.   Cardiovascular:  Negative.   Gastrointestinal: Negative.   Endocrine: Negative.   Genitourinary: Negative.   Musculoskeletal: Negative.   Allergic/Immunologic: Negative.   Neurological: Negative.   Hematological: Negative.   Psychiatric/Behavioral: Negative.   All other systems reviewed and are negative.      Objective:    BP 138/88 (BP Location: Left Arm, Patient Position: Sitting, Cuff Size: Normal)   Pulse 89   Ht 5\' 10"  (1.778 m)   Wt 210 lb (95.3 kg)   SpO2 97%   BMI 30.13 kg/m    Physical Exam  General:  pleasant male in no acute distress Eyes:  PERRL Ears:  External ear exam shows no significant lesions or deformities.  TMs normal bilaterally Hearing is grossly normal bilaterally. Nose:  External nasal examination shows no deformity or inflammation. Nasal mucosa are pink and moist  without lesions or exudates. Mouth:  Oral mucosa and oropharynx without lesions or exudates.  Teeth in good repair. Neck:  no carotid bruit or thyromegaly no cervical or supraclavicular lymphadenopathy  Lungs:  Normal respiratory effort, chest expands symmetrically. Lungs are clear to auscultation, no crackles or wheezes. Heart:  Normal rate and regular rhythm. S1 and S2 normal without gallop, murmur, click, rub or other extra sounds. Abdomen:  Bowel sounds positive,abdomen soft and non-tender without masses, organomegaly or hernias noted. Pulses:  R and L posterior tibial pulses are full and equal bilaterally  Extremities:  no edema  Psych:  Good eye contact, not anxious or depressed appearing       Assessment & Plan:   Visit for well man health check - Plan: CBC with Differential/Platelet, Comprehensive metabolic panel, Lipid panel, PSA  Essential hypertension  Hyperlipidemia, unspecified hyperlipidemia type  Gastroesophageal reflux disease, esophagitis presence not specified  Anxiety state No Follow-up on file.

## 2017-05-13 NOTE — Addendum Note (Signed)
Addended by: Mady Haagensen on: 05/13/2017 03:45 PM   Modules accepted: Orders, SmartSet

## 2017-05-13 NOTE — Assessment & Plan Note (Signed)
Doing well on current rx. Followed by psych

## 2017-05-13 NOTE — Assessment & Plan Note (Signed)
Well controlled on current rx. Labs today.

## 2017-05-13 NOTE — Assessment & Plan Note (Signed)
Due for labs today. No changes made. 

## 2017-05-13 NOTE — Assessment & Plan Note (Signed)
Reviewed preventive care protocols, scheduled due services, and updated immunizations Discussed nutrition, exercise, diet, and healthy lifestyle.  

## 2017-05-15 LAB — CBC WITH DIFFERENTIAL/PLATELET
BASOS: 1 %
Basophils Absolute: 0.1 10*3/uL (ref 0.0–0.2)
EOS (ABSOLUTE): 0.4 10*3/uL (ref 0.0–0.4)
EOS: 5 %
HEMATOCRIT: 46.3 % (ref 37.5–51.0)
Hemoglobin: 15.6 g/dL (ref 13.0–17.7)
IMMATURE GRANS (ABS): 0 10*3/uL (ref 0.0–0.1)
IMMATURE GRANULOCYTES: 0 %
LYMPHS: 30 %
Lymphocytes Absolute: 2 10*3/uL (ref 0.7–3.1)
MCH: 28.3 pg (ref 26.6–33.0)
MCHC: 33.7 g/dL (ref 31.5–35.7)
MCV: 84 fL (ref 79–97)
MONOCYTES: 7 %
Monocytes Absolute: 0.5 10*3/uL (ref 0.1–0.9)
NEUTROS PCT: 57 %
Neutrophils Absolute: 3.9 10*3/uL (ref 1.4–7.0)
Platelets: 222 10*3/uL (ref 150–379)
RBC: 5.51 x10E6/uL (ref 4.14–5.80)
RDW: 14.7 % (ref 12.3–15.4)
WBC: 6.8 10*3/uL (ref 3.4–10.8)

## 2017-05-15 LAB — LIPID PANEL
CHOL/HDL RATIO: 4.8 ratio (ref 0.0–5.0)
Cholesterol, Total: 200 mg/dL — ABNORMAL HIGH (ref 100–199)
HDL: 42 mg/dL (ref >39)
LDL CALC: 133 mg/dL — AB (ref 0–99)
Triglycerides: 125 mg/dL (ref 0–149)
VLDL Cholesterol Cal: 25 mg/dL (ref 5–40)

## 2017-05-15 LAB — COMPREHENSIVE METABOLIC PANEL
A/G RATIO: 2.4 — AB (ref 1.2–2.2)
ALT: 39 IU/L (ref 0–44)
AST: 22 IU/L (ref 0–40)
Albumin: 5 g/dL (ref 3.5–5.5)
Alkaline Phosphatase: 65 IU/L (ref 39–117)
BUN/Creatinine Ratio: 15 (ref 9–20)
BUN: 16 mg/dL (ref 6–24)
Bilirubin Total: 0.6 mg/dL (ref 0.0–1.2)
CALCIUM: 9.9 mg/dL (ref 8.7–10.2)
CO2: 19 mmol/L — ABNORMAL LOW (ref 20–29)
Chloride: 104 mmol/L (ref 96–106)
Creatinine, Ser: 1.07 mg/dL (ref 0.76–1.27)
GFR, EST AFRICAN AMERICAN: 94 mL/min/{1.73_m2} (ref >59)
GFR, EST NON AFRICAN AMERICAN: 81 mL/min/{1.73_m2} (ref >59)
GLOBULIN, TOTAL: 2.1 g/dL (ref 1.5–4.5)
Glucose: 104 mg/dL — ABNORMAL HIGH (ref 65–99)
POTASSIUM: 4.8 mmol/L (ref 3.5–5.2)
SODIUM: 145 mmol/L — AB (ref 134–144)
TOTAL PROTEIN: 7.1 g/dL (ref 6.0–8.5)

## 2017-05-15 LAB — PSA: PROSTATE SPECIFIC AG, SERUM: 1.8 ng/mL (ref 0.0–4.0)

## 2017-08-08 ENCOUNTER — Encounter: Payer: Self-pay | Admitting: Family Medicine

## 2017-09-23 ENCOUNTER — Telehealth: Payer: Self-pay | Admitting: Family Medicine

## 2017-09-23 NOTE — Telephone Encounter (Signed)
Pt would like medications sent to [facility]

## 2017-09-23 NOTE — Telephone Encounter (Signed)
Copied from [place] [id number]. Topic: Quick Communication - See Telephone Encounter >> Sep 23, 2017  3:03 PM Conception Chancy, NT wrote: CRM for notification. See Telephone encounter for:  09/23/17.

## 2017-09-23 NOTE — Telephone Encounter (Signed)
Called left message  when pt calls back clarify which mail order pharmacy

## 2017-09-25 MED ORDER — EZETIMIBE 10 MG PO TABS: 10.0000 mg | ORAL_TABLET | Freq: Every day | ORAL | 1 refills | Status: DC

## 2017-09-25 MED ORDER — ROSUVASTATIN CALCIUM 40 MG PO TABS: 40.0000 mg | ORAL_TABLET | Freq: Every day | ORAL | 1 refills | Status: DC

## 2017-09-25 NOTE — Telephone Encounter (Signed)
[facility]. Pt need to follow up with PCP for more refills.

## 2017-09-25 NOTE — Addendum Note (Signed)
Addended byShawnie Pons on: 09/25/2017 09:08 AM   Modules accepted: Orders

## 2017-11-05 HISTORY — PX: COLONOSCOPY: SHX174

## 2017-11-26 ENCOUNTER — Encounter: Payer: Self-pay | Admitting: Internal Medicine

## 2018-01-22 ENCOUNTER — Ambulatory Visit (AMBULATORY_SURGERY_CENTER): Payer: Self-pay | Admitting: *Deleted

## 2018-01-22 ENCOUNTER — Other Ambulatory Visit: Payer: Self-pay

## 2018-01-22 VITALS — Ht 70.0 in | Wt 218.0 lb

## 2018-01-22 DIAGNOSIS — Z1211 Encounter for screening for malignant neoplasm of colon: Secondary | ICD-10-CM

## 2018-01-22 MED ORDER — PEG-KCL-NACL-NASULF-NA ASC-C 140 G PO SOLR: 1.0000 | ORAL | 0 refills | Status: DC

## 2018-01-22 NOTE — Progress Notes (Signed)
No egg or soy allergy known to patient  No issues with past sedation with any surgeries  or procedures, no intubation problems  No diet pills per patient No home 02 use per patient  No blood thinners per patient  Pt denies issues with constipation  No A fib or A flutter  EMMI video sent to pt's e mail  

## 2018-01-23 ENCOUNTER — Encounter: Payer: Self-pay | Admitting: Internal Medicine

## 2018-02-03 ENCOUNTER — Ambulatory Visit (AMBULATORY_SURGERY_CENTER): Payer: PRIVATE HEALTH INSURANCE | Admitting: Internal Medicine

## 2018-02-03 ENCOUNTER — Encounter: Payer: Self-pay | Admitting: Internal Medicine

## 2018-02-03 ENCOUNTER — Other Ambulatory Visit: Payer: Self-pay

## 2018-02-03 VITALS — BP 128/98 | HR 63 | Temp 98.0°F | Resp 12

## 2018-02-03 DIAGNOSIS — Z1211 Encounter for screening for malignant neoplasm of colon: Secondary | ICD-10-CM

## 2018-02-03 DIAGNOSIS — D120 Benign neoplasm of cecum: Secondary | ICD-10-CM

## 2018-02-03 DIAGNOSIS — D122 Benign neoplasm of ascending colon: Secondary | ICD-10-CM | POA: Diagnosis not present

## 2018-02-03 DIAGNOSIS — D128 Benign neoplasm of rectum: Secondary | ICD-10-CM

## 2018-02-03 DIAGNOSIS — K621 Rectal polyp: Secondary | ICD-10-CM | POA: Diagnosis not present

## 2018-02-03 MED ORDER — SODIUM CHLORIDE 0.9 % IV SOLN: 500.0000 mL | Freq: Once | INTRAVENOUS | Status: DC

## 2018-02-03 NOTE — Progress Notes (Signed)
Pt's states no medical or surgical changes since previsit or office visit. 

## 2018-02-03 NOTE — Op Note (Signed)
[place] Patient Name: [name] Procedure Date: 02/03/2018 7:53 AM MRN: [id number] Endoscopist: Docia Chuck. Henrene Pastor , MD Age: 52 Referring MD:  Date of Birth: [birth year] Gender: Male Account #: [id number] Procedure:                Colonoscopy, with cold snare polypectomy x 3 Indications:              Screening for colorectal malignant neoplasm Medicines:                Monitored Anesthesia Care Procedure:                Pre-Anesthesia Assessment:                           - Prior to the procedure, a History and Physical                            was performed, and patient medications and                            allergies were reviewed. The patient's tolerance of                            previous anesthesia was also reviewed. The risks                            and benefits of the procedure and the sedation                            options and risks were discussed with the patient.                            All questions were answered, and informed consent                            was obtained. Prior Anticoagulants: The patient has                            taken no previous anticoagulant or antiplatelet                            agents. ASA Grade Assessment: II - A patient with                            mild systemic disease. After reviewing the risks                            and benefits, the patient was deemed in                            satisfactory condition to undergo the procedure.                           After obtaining informed consent, the colonoscope  was passed under direct vision. Throughout the                            procedure, the patient's blood pressure, pulse, and                            oxygen saturations were monitored continuously. The                            Colonoscope was introduced through the anus and                            advanced to the the cecum, identified by   appendiceal orifice and ileocecal valve. The                            ileocecal valve, appendiceal orifice, and rectum                            were photographed. The quality of the bowel                            preparation was excellent. The colonoscopy was                            performed without difficulty. The patient tolerated                            the procedure well. The bowel preparation used was                            SUPREP. Scope In: 8:10:31 AM Scope Out: 8:29:36 AM Scope Withdrawal Time: 0 hours 16 minutes 45 seconds  Total Procedure Duration: 0 hours 19 minutes 5 seconds  Findings:                 Three polyps were found in the rectum, ascending                            colon and cecum. The polyps were 3 to 6 mm in size.                            These polyps were removed with a cold snare.                            Resection and retrieval were complete.                           Internal hemorrhoids were found during retroflexion.                           The exam was otherwise without abnormality on                            direct and retroflexion  views. Complications:            No immediate complications. Estimated blood loss:                            None. Estimated Blood Loss:     Estimated blood loss: none. Impression:               - Three 3 to 6 mm polyps in the rectum, in the                            ascending colon and in the cecum, removed with a                            cold snare. Resected and retrieved.                           - Internal hemorrhoids.                           - The examination was otherwise normal on direct                            and retroflexion views. Recommendation:           - Repeat colonoscopy in 5 years for surveillance.                           - Patient has a contact number available for                            emergencies. The signs and symptoms of potential                             delayed complications were discussed with the                            patient. Return to normal activities tomorrow.                            Written discharge instructions were provided to the                            patient.                           - Resume previous diet.                           - Continue present medications.                           - Await pathology results. Docia Chuck. Henrene Pastor, MD 02/03/2018 8:40:55 AM This report has been signed electronically.

## 2018-02-03 NOTE — Patient Instructions (Signed)
**  Handouts given on polyps and hemorrhoids**   YOU HAD AN ENDOSCOPIC PROCEDURE TODAY: Refer to the procedure report and other information in the discharge instructions given to you for any specific questions about what was found during the examination. If this information does not answer your questions, please call  office at [phone] to clarify.   YOU SHOULD EXPECT: Some feelings of bloating in the abdomen. Passage of more gas than usual. Walking can help get rid of the air that was put into your GI tract during the procedure and reduce the bloating. If you had a lower endoscopy (such as a colonoscopy or flexible sigmoidoscopy) you may notice spotting of blood in your stool or on the toilet paper. Some abdominal soreness may be present for a day or two, also.  DIET: Your first meal following the procedure should be a light meal and then it is ok to progress to your normal diet. A half-sandwich or bowl of soup is an example of a good first meal. Heavy or fried foods are harder to digest and may make you feel nauseous or bloated. Drink plenty of fluids but you should avoid alcoholic beverages for 24 hours. If you had a esophageal dilation, please see attached instructions for diet.    ACTIVITY: Your care partner should take you home directly after the procedure. You should plan to take it easy, moving slowly for the rest of the day. You can resume normal activity the day after the procedure however YOU SHOULD NOT DRIVE, use power tools, machinery or perform tasks that involve climbing or major physical exertion for 24 hours (because of the sedation medicines used during the test).   SYMPTOMS TO REPORT IMMEDIATELY: A gastroenterologist can be reached at any hour. Please call [phone]  for any of the following symptoms:  Following lower endoscopy (colonoscopy, flexible sigmoidoscopy) Excessive amounts of blood in the stool  Significant tenderness, worsening of abdominal pains  Swelling of  the abdomen that is new, acute  Fever of 100 or higher    FOLLOW UP:  If any biopsies were taken you will be contacted by phone or by letter within the next 1-3 weeks. Call [phone]  if you have not heard about the biopsies in 3 weeks.  Please also call with any specific questions about appointments or follow up tests.  

## 2018-02-03 NOTE — Progress Notes (Signed)
Called to room to assist during endoscopic procedure.  Patient ID and intended procedure confirmed with present staff. Received instructions for my participation in the procedure from the performing physician.  

## 2018-02-03 NOTE — Progress Notes (Signed)
To recovery, report to RN, VSS. 

## 2018-02-04 ENCOUNTER — Telehealth: Payer: Self-pay

## 2018-02-04 NOTE — Telephone Encounter (Signed)
Called [phone] and left a messaged we tried to reach pt for a follow up call. maw

## 2018-02-04 NOTE — Telephone Encounter (Signed)
Called [phone] and left a messaged we tried to reach pt for a follow up call. maw

## 2018-02-07 ENCOUNTER — Encounter: Payer: Self-pay | Admitting: Internal Medicine

## 2018-04-08 ENCOUNTER — Other Ambulatory Visit: Payer: Self-pay | Admitting: Family Medicine

## 2018-04-08 NOTE — Telephone Encounter (Signed)
Copied from [place] [id number]. Topic: Quick Communication - Rx Refill/Question >> Apr 08, 2018 11:43 AM Oliver Pila B wrote: Medication: pantoprazole (PROTONIX) 40 MG tablet [[id number]]   Has the patient contacted their pharmacy? Yes.   (Agent: If no, request that the patient contact the pharmacy for the refill.) (Agent: If yes, when and what did the pharmacy advise?)  Preferred Pharmacy (with phone number or street name): [facility]  Agent: Please be advised that RX refills may take up to 3 business days. We ask that you follow-up with your pharmacy.

## 2018-04-09 MED ORDER — PANTOPRAZOLE SODIUM 40 MG PO TBEC: 40.0000 mg | DELAYED_RELEASE_TABLET | Freq: Two times a day (BID) | ORAL | 0 refills | Status: DC

## 2018-04-09 NOTE — Telephone Encounter (Signed)
LOV 05-13-17 with Dr. Deborra Medina / Refill request for protonix / Last filled:  Disp Refills Start End   pantoprazole (PROTONIX) 40 MG tablet 90 tablet 3 05/13/2017    Sig - Route: Take 1 tablet (40 mg total) by mouth 2 (two) times daily. - Oral   Sent to pharmacy as: pantoprazole (PROTONIX) 40 MG tablet   Notes to Pharmacy: Needs appointment for further refills    / Patient has an appointment on 04-28-18 /

## 2018-04-14 LAB — LIPID PANEL
Cholesterol: 243 — AB (ref 0–200)
HDL: 44 (ref 35–70)
LDL Cholesterol: 166
Triglycerides: 164 — AB (ref 40–160)

## 2018-04-14 LAB — HEPATIC FUNCTION PANEL
ALT: 51 — AB (ref 10–40)
AST: 24 (ref 14–40)
Alkaline Phosphatase: 73 (ref 25–125)
BILIRUBIN, TOTAL: 0.6

## 2018-04-14 LAB — VITAMIN D 25 HYDROXY (VIT D DEFICIENCY, FRACTURES): Vit D, 25-Hydroxy: 42.5

## 2018-04-14 LAB — CBC AND DIFFERENTIAL
HEMATOCRIT: 46 (ref 41–53)
HEMOGLOBIN: 15.5 (ref 13.5–17.5)
Neutrophils Absolute: 4
Platelets: 231 (ref 150–399)
WBC: 7.3

## 2018-04-14 LAB — BASIC METABOLIC PANEL
BUN: 18 (ref 4–21)
CREATININE: 1.1 (ref 0.6–1.3)
Glucose: 112
Potassium: 4.8 (ref 3.4–5.3)
Sodium: 141 (ref 137–147)

## 2018-04-14 LAB — HEMOGLOBIN A1C: Hemoglobin A1C: 5.2

## 2018-04-14 LAB — TSH: TSH: 1.72 (ref 0.41–5.90)

## 2018-04-28 ENCOUNTER — Ambulatory Visit (INDEPENDENT_AMBULATORY_CARE_PROVIDER_SITE_OTHER): Payer: PRIVATE HEALTH INSURANCE | Admitting: Family Medicine

## 2018-04-28 ENCOUNTER — Ambulatory Visit (INDEPENDENT_AMBULATORY_CARE_PROVIDER_SITE_OTHER): Payer: PRIVATE HEALTH INSURANCE

## 2018-04-28 VITALS — BP 135/76 | HR 90 | Temp 98.8°F | Ht 70.0 in | Wt 208.0 lb

## 2018-04-28 DIAGNOSIS — E7800 Pure hypercholesterolemia, unspecified: Secondary | ICD-10-CM | POA: Diagnosis not present

## 2018-04-28 DIAGNOSIS — Z0000 Encounter for general adult medical examination without abnormal findings: Secondary | ICD-10-CM | POA: Diagnosis not present

## 2018-04-28 DIAGNOSIS — Z125 Encounter for screening for malignant neoplasm of prostate: Secondary | ICD-10-CM | POA: Diagnosis not present

## 2018-04-28 DIAGNOSIS — R202 Paresthesia of skin: Secondary | ICD-10-CM | POA: Diagnosis not present

## 2018-04-28 DIAGNOSIS — K219 Gastro-esophageal reflux disease without esophagitis: Secondary | ICD-10-CM | POA: Diagnosis not present

## 2018-04-28 DIAGNOSIS — R200 Anesthesia of skin: Secondary | ICD-10-CM | POA: Insufficient documentation

## 2018-04-28 DIAGNOSIS — E538 Deficiency of other specified B group vitamins: Secondary | ICD-10-CM | POA: Diagnosis not present

## 2018-04-28 DIAGNOSIS — I10 Essential (primary) hypertension: Secondary | ICD-10-CM

## 2018-04-28 DIAGNOSIS — F339 Major depressive disorder, recurrent, unspecified: Secondary | ICD-10-CM

## 2018-04-28 NOTE — Assessment & Plan Note (Signed)
B12 today 

## 2018-04-28 NOTE — Assessment & Plan Note (Signed)
Well controlled 

## 2018-04-28 NOTE — Patient Instructions (Addendum)
  Great to see you. I will call you with your lab results and your xray results from today and you can view them online.

## 2018-04-28 NOTE — Progress Notes (Signed)
Subjective:   Patient ID: [name], male    DOB: 11/12/1965, 52 y.o.   MRN: [id number]  [name] is a pleasant 52 y.o. year old male who presents to clinic today with Annual Exam (pt. here today for annual physical; he reports bilateral hand pain, but right side is a little worse; pain level 5/10)  on 04/28/2018  HPI:  Health Maintenance  Topic Date Due  . INFLUENZA VACCINE  06/05/2018  . TETANUS/TDAP  02/18/2020  . COLONOSCOPY  02/04/2023  . HIV Screening  Completed   Anxiety/depression- Has been managed by psychiatry. Currently taking Lamictal, Viibryd with rarely as needed Klonipin and feels it is working well without side effects. Labs were done 2 weeks ago by psychiatry- we requested these labs during his OV- LDL was elevated to 166.  Per pt, he was not fasting.   HLD- had been better controlled once he restarted Crestor 40 mg daily (in addition to zetia).  Will check a direct LDL today.  If still elevated, will consider sending him to cardiology to discuss possible trial of  PCSK9 inhibitors.   Bilateral hand pain/numbness- right > left. No known injury.  No UE weakness. Can sometimes shake it out.  Current Outpatient Medications on File Prior to Visit  Medication Sig Dispense Refill  . cetirizine (ZYRTEC) 10 MG chewable tablet Chew 10 mg by mouth daily.    . Cholecalciferol (VITAMIN D3 ADULT GUMMIES PO) Take 3 tablets by mouth daily. Gummies    . clonazePAM (KLONOPIN) 0.5 MG tablet Take 0.5 mg by mouth daily as needed. for anxiety  3  . Cyanocobalamin (VITAMIN B 12 PO) Take 3 tablets by mouth daily. Gummies    . ezetimibe (ZETIA) 10 MG tablet Take 1 tablet (10 mg total) by mouth daily. 90 tablet 1  . fexofenadine (ALLEGRA) 180 MG tablet Take 180 mg by mouth daily.    Marland Kitchen lamoTRIgine (LAMICTAL) 200 MG tablet Take 200 mg by mouth daily.    . Multiple Vitamin (MULTIVITAMIN) tablet Take 1 tablet by mouth daily.    . pantoprazole (PROTONIX) 40 MG tablet Take 1 tablet  (40 mg total) by mouth 2 (two) times daily. 60 tablet 0  . rosuvastatin (CRESTOR) 40 MG tablet Take 1 tablet (40 mg total) by mouth daily. 90 tablet 1  . VIIBRYD 40 MG TABS Take 40 mg by mouth every morning.  2   Current Facility-Administered Medications on File Prior to Visit  Medication Dose Route Frequency Provider Last Rate Last Dose  . 0.9 %  sodium chloride infusion  500 mL Intravenous Once Irene Shipper, MD        Allergies  Allergen Reactions  . Bupropion Hcl     REACTION: anxiety  . Fluoxetine Hcl     REACTION: more anxious and tired    Past Medical History:  Diagnosis Date  . ADHD (attention deficit hyperactivity disorder)   . Allergy   . Anxiety   . Depression   . GERD (gastroesophageal reflux disease)   . Hyperlipidemia     Past Surgical History:  Procedure Laterality Date  . [place]   left  . NASAL SINUS SURGERY    . SPINE SURGERY  1999   Lumbar   . VARICOCELE EXCISION  12/2005    Family History  Problem Relation Age of Onset  . Hyperlipidemia Mother   . Heart attack Mother 15  . Hypertension Brother   . Colon cancer Neg Hx   .  Colon polyps Neg Hx   . Esophageal cancer Neg Hx   . Stomach cancer Neg Hx   . Rectal cancer Neg Hx   . Liver cancer Neg Hx   . Pancreatic cancer Neg Hx   . Prostate cancer Neg Hx     Social History   Socioeconomic History  . Marital status: Married    Spouse name: Not on file  . Number of children: Not on file  . Years of education: Not on file  . Highest education level: Not on file  Occupational History  . Occupation: Scientific laboratory technician: [employer]  Social Needs  . Financial resource strain: Not on file  . Food insecurity:    Worry: Not on file    Inability: Not on file  . Transportation needs:    Medical: Not on file    Non-medical: Not on file  Tobacco Use  . Smoking status: Never Smoker  . Smokeless tobacco: Current User    Types: Snuff  Substance and Sexual Activity    . Alcohol use: Yes    Comment: occasionally  . Drug use: No  . Sexual activity: Not on file  Lifestyle  . Physical activity:    Days per week: Not on file    Minutes per session: Not on file  . Stress: Not on file  Relationships  . Social connections:    Talks on phone: Not on file    Gets together: Not on file    Attends religious service: Not on file    Active member of club or organization: Not on file    Attends meetings of clubs or organizations: Not on file    Relationship status: Not on file  . Intimate partner violence:    Fear of current or ex partner: Not on file    Emotionally abused: Not on file    Physically abused: Not on file    Forced sexual activity: Not on file  Other Topics Concern  . Not on file  Social History Narrative  . Not on file   The PMH, PSH, Social History, Family History, Medications, and allergies have been reviewed in [facility], and have been updated if relevant.   Review of Systems  Constitutional: Negative.   HENT: Negative.   Eyes: Negative.   Respiratory: Negative.   Cardiovascular: Negative.   Gastrointestinal: Negative.   Endocrine: Negative.   Genitourinary: Negative.   Musculoskeletal: Positive for arthralgias.  Skin: Negative.   Allergic/Immunologic: Negative.   Neurological: Positive for numbness. Negative for dizziness, tremors, seizures, syncope, facial asymmetry, speech difficulty, weakness, light-headedness and headaches.  Hematological: Negative.   Psychiatric/Behavioral: Negative.   All other systems reviewed and are negative.      Objective:    BP 135/76 (BP Location: Left Arm, Cuff Size: Large)   Pulse 90   Temp 98.8 F (37.1 C) (Oral)   Ht 5\' 10"  (1.778 m)   Wt 208 lb (94.3 kg)   SpO2 98%   BMI 29.84 kg/m    Physical Exam  General:  pleasant male in no acute distress Eyes:  PERRL Ears:  External ear exam shows no significant lesions or deformities.  TMs normal bilaterally Hearing is grossly normal  bilaterally. Nose:  External nasal examination shows no deformity or inflammation. Nasal mucosa are pink and moist without lesions or exudates. Mouth:  Oral mucosa and oropharynx without lesions or exudates.  Teeth in good repair. Neck:  no carotid bruit or thyromegaly no  cervical or supraclavicular lymphadenopathy  Lungs:  Normal respiratory effort, chest expands symmetrically. Lungs are clear to auscultation, no crackles or wheezes. Heart:  Normal rate and regular rhythm. S1 and S2 normal without gallop, murmur, click, rub or other extra sounds. Abdomen:  Bowel sounds positive,abdomen soft and non-tender without masses, organomegaly or hernias noted. Pulses:  R and L posterior tibial pulses are full and equal bilaterally  Extremities:  no edema  Psych:  Good eye contact, not anxious or depressed appearing Neuro:  Normal grip strength bilaterally      Assessment & Plan:   Visit for well man health check - Plan: PSA, B12, Direct LDL  Pure hypercholesterolemia - Plan: Direct LDL, CANCELED: Direct LDL  Essential hypertension  B12 deficiency - Plan: B12, CANCELED: B12  Gastroesophageal reflux disease without esophagitis  Screening for prostate cancer - Plan: CANCELED: PSA  Numbness and tingling in right hand - Plan: DG Cervical Spine Complete, DG Cervical Spine Complete No follow-ups on file.

## 2018-04-28 NOTE — Assessment & Plan Note (Signed)
Followed by psychiatry and under good control. No changes made to rxs today.

## 2018-04-28 NOTE — Assessment & Plan Note (Signed)
Reviewed preventive care protocols, scheduled due services, and updated immunizations Discussed nutrition, exercise, diet, and healthy lifestyle.  

## 2018-04-28 NOTE — Assessment & Plan Note (Signed)
?  cervical impingement vs carpal tunnel.  More likely cervical impingement.  Tinel neg. Cervical xray today.  Will likely need to proceed with PT and or MRI.

## 2018-04-28 NOTE — Assessment & Plan Note (Signed)
Had been better controlled once he restarted Crestor 40 mH daily (in addition to zetia).  Will check a direct LDL today.  If still elevated, will consider sending him to cardiology to discuss possible trial of  PCSK9 inhibitors. Orders Placed This Encounter  Procedures  . DG Cervical Spine Complete  . PSA  . B12  . Direct LDL

## 2018-04-29 ENCOUNTER — Telehealth: Payer: Self-pay | Admitting: Family Medicine

## 2018-04-29 LAB — LDL CHOLESTEROL, DIRECT: LDL DIRECT: 105 mg/dL — AB (ref 0–99)

## 2018-04-29 LAB — VITAMIN B12: Vitamin B-12: 852 pg/mL (ref 232–1245)

## 2018-04-29 LAB — PSA: Prostate Specific Ag, Serum: 1.6 ng/mL (ref 0.0–4.0)

## 2018-04-29 MED ORDER — ROSUVASTATIN CALCIUM 40 MG PO TABS: 40.0000 mg | ORAL_TABLET | Freq: Every day | ORAL | 3 refills | Status: DC

## 2018-04-29 MED ORDER — EZETIMIBE 10 MG PO TABS: 10.0000 mg | ORAL_TABLET | Freq: Every day | ORAL | 3 refills | Status: DC

## 2018-04-29 NOTE — Telephone Encounter (Signed)
Left vm for the pt to call back.  

## 2018-04-29 NOTE — Telephone Encounter (Signed)
Copied from [place] [id number]. Topic: Quick Communication - See Telephone Encounter >> Apr 29, 2018  3:32 PM Bea Graff, NT wrote: CRM for notification. See Telephone encounter for: 04/29/18. Pt calling back to get his lab and x-ray results. Also he states his medicine was not sent to the pharmacy yesterday.

## 2018-04-29 NOTE — Telephone Encounter (Signed)
Pt is aware of test result and rx sent in as pt requested.

## 2018-04-30 ENCOUNTER — Other Ambulatory Visit: Payer: Self-pay | Admitting: Family Medicine

## 2018-04-30 DIAGNOSIS — M5030 Other cervical disc degeneration, unspecified cervical region: Secondary | ICD-10-CM

## 2018-05-09 ENCOUNTER — Encounter: Payer: Self-pay | Admitting: Family Medicine

## 2018-05-09 NOTE — Progress Notes (Signed)
[facility]/thx dmf

## 2018-05-15 ENCOUNTER — Ambulatory Visit: Payer: PRIVATE HEALTH INSURANCE

## 2018-05-22 ENCOUNTER — Ambulatory Visit: Payer: PRIVATE HEALTH INSURANCE

## 2018-05-29 ENCOUNTER — Other Ambulatory Visit: Payer: Self-pay | Admitting: Family Medicine

## 2018-05-31 ENCOUNTER — Other Ambulatory Visit: Payer: Self-pay | Admitting: Family Medicine

## 2018-06-24 ENCOUNTER — Other Ambulatory Visit: Payer: Self-pay | Admitting: Family Medicine

## 2018-08-06 ENCOUNTER — Telehealth: Payer: Self-pay

## 2018-08-06 NOTE — Telephone Encounter (Signed)
Copied from [place] [id number]. Topic: Inquiry >> Aug 01, 2018  3:00 PM Oliver Pila B wrote: Reason for CRM: pt called to ask pcp to have a copy of the images from his June x-ray; contact pt to advise

## 2018-08-12 NOTE — Telephone Encounter (Signed)
LMOVM asking for clarification/is he wanting a copy of the images for his own records? Or is he changing offices and needs a copy for a different PCP? thx dmf

## 2018-08-22 ENCOUNTER — Other Ambulatory Visit: Payer: Self-pay | Admitting: Family Medicine

## 2018-09-18 ENCOUNTER — Other Ambulatory Visit: Payer: Self-pay | Admitting: Family Medicine

## 2018-10-28 ENCOUNTER — Other Ambulatory Visit: Payer: Self-pay | Admitting: *Deleted

## 2018-10-28 DIAGNOSIS — R200 Anesthesia of skin: Secondary | ICD-10-CM

## 2018-10-30 ENCOUNTER — Ambulatory Visit (INDEPENDENT_AMBULATORY_CARE_PROVIDER_SITE_OTHER): Payer: PRIVATE HEALTH INSURANCE | Admitting: Neurology

## 2018-10-30 DIAGNOSIS — G5603 Carpal tunnel syndrome, bilateral upper limbs: Secondary | ICD-10-CM

## 2018-10-30 DIAGNOSIS — R200 Anesthesia of skin: Secondary | ICD-10-CM

## 2018-10-30 NOTE — Procedures (Addendum)
[facility]  [address]  [place], [place] [address] Tel: [phone] Fax:  [phone] Test Date:  10/30/2018  Patient: [name] DOB: 02-04-1966 Physician: Narda Amber, DO  Sex: Male Height: 5\' 10"  Ref Phys: Verner Chol, MD  ID#: [id number] Temp: 37.0C Technician:    Patient Complaints: This is a 52 year-old man referred for evaluation of bilateral hand numbness/tingling.  NCV & EMG Findings: Extensive electrodiagnostic testing of the right upper extremity and additional studies of the left shows:  1. Right median sensory response shows prolonged distal peak latency (5.8 ms) and reduced amplitude (6.2 V).  Left mixed palmar sensory responses show prolonged latency.  Left median and bilateral ulnar sensory responses are within normal limits.   2. Right median motor response shows prolonged distal onset latency (7.0 ms).  Left median and bilateral ulnar motor responses are within normal limits. 3. There is no evidence of active or chronic motor axonal loss changes affecting any of the tested muscles.  Motor unit configuration and recruitment pattern is within normal limits.  Impression: 1. Right median neuropathy at or distal to the wrist, consistent with a clinical diagnosis of carpal tunnel syndrome.  Overall, these findings are moderate-to-severe in degree electrically. 2. Left median neuropathy at or distal to the wrist, consistent with a clinical diagnosis of carpal tunnel syndrome.  Overall, these findings are very mild in degree electrically.   ___________________________ Narda Amber, DO    Nerve Conduction Studies Anti Sensory Summary Table   Stim Site NR Peak (ms) Norm Peak (ms) P-T Amp (V) Norm P-T Amp  Left Median Anti Sensory (2nd Digit)  37C  Wrist    3.3 <3.6 22.9 >15  Right Median Anti Sensory (2nd Digit)  37C  Wrist    5.8 <3.6 6.2 >15  Left Ulnar Anti Sensory (5th Digit)  37C  Wrist    2.6 <3.1 30.4 >10  Right Ulnar  Anti Sensory (5th Digit)  37C  Wrist    2.8 <3.1 30.6 >10   Motor Summary Table   Stim Site NR Onset (ms) Norm Onset (ms) O-P Amp (mV) Norm O-P Amp Site1 Site2 Delta-0 (ms) Dist (cm) Vel (m/s) Norm Vel (m/s)  Left Median Motor (Abd Poll Brev)  37C  Wrist    3.7 <4.0 8.6 >6 Elbow Wrist 5.1 27.0 53 >50  Elbow    8.8  8.6         Right Median Motor (Abd Poll Brev)  37C  Wrist    7.0 <4.0 7.0 >6 Elbow Wrist 5.0 0.0  >50  Elbow    12.0  6.5         Left Ulnar Motor (Abd Dig Minimi)  37C  Wrist    2.1 <3.1 8.7 >7 B Elbow Wrist 4.1 24.0 59 >50  B Elbow    6.2  8.1  A Elbow B Elbow 1.8 10.0 56 >50  A Elbow    8.0  8.1         Right Ulnar Motor (Abd Dig Minimi)  37C  Wrist    2.1 <3.1 9.6 >7 B Elbow Wrist 3.8 23.0 61 >50  B Elbow    5.9  8.8  A Elbow B Elbow 1.9 10.0 53 >50  A Elbow    7.8  8.5          Comparison Summary Table   Stim Site NR Peak (ms) Norm Peak (ms) P-T Amp (V) Site1 Site2 Delta-P (ms) Norm  Delta (ms)  Left Median/Ulnar Palm Comparison (Wrist - 8cm)  37C  Median Palm    2.0 <2.2 64.8 Median Palm Ulnar Palm 0.5   Ulnar Palm    1.5 <2.2 17.6       EMG   Side Muscle Ins Act Fibs Psw Fasc Number Recrt Dur Dur. Amp Amp. Poly Poly. Comment  Left 1stDorInt Nml Nml Nml Nml Nml Nml Nml Nml Nml Nml Nml Nml N/A  Left Abd Poll Brev Nml Nml Nml Nml Nml Nml Nml Nml Nml Nml Nml Nml N/A  Left PronatorTeres Nml Nml Nml Nml Nml Nml Nml Nml Nml Nml Nml Nml N/A  Left Biceps Nml Nml Nml Nml Nml Nml Nml Nml Nml Nml Nml Nml N/A  Left Triceps Nml Nml Nml Nml Nml Nml Nml Nml Nml Nml Nml Nml N/A  Left Deltoid Nml Nml Nml Nml Nml Nml Nml Nml Nml Nml Nml Nml N/A  Right 1stDorInt Nml Nml Nml Nml Nml Nml Nml Nml Nml Nml Nml Nml N/A  Right Abd Poll Brev Nml Nml Nml Nml Nml Nml Nml Nml Nml Nml Nml Nml N/A  Right PronatorTeres Nml Nml Nml Nml Nml Nml Nml Nml Nml Nml Nml Nml N/A  Right Biceps Nml Nml Nml Nml Nml Nml Nml Nml Nml Nml Nml Nml N/A  Right Triceps Nml Nml Nml Nml Nml Nml Nml Nml Nml  Nml Nml Nml N/A  Right Deltoid Nml Nml Nml Nml Nml Nml Nml Nml Nml Nml Nml Nml N/A      Waveforms:

## 2018-12-06 HISTORY — PX: CARPAL TUNNEL RELEASE: SHX101

## 2019-01-18 ENCOUNTER — Other Ambulatory Visit: Payer: Self-pay | Admitting: Family Medicine

## 2019-03-17 ENCOUNTER — Telehealth: Payer: Self-pay | Admitting: Family Medicine

## 2019-03-17 NOTE — Telephone Encounter (Signed)
I called patient to schedule CPE. I was unable to leave voicemail, patient voicemail was full.

## 2019-05-10 NOTE — Assessment & Plan Note (Addendum)
Deteriorated.  Referred back to GI for endoscopy. Stop prilosec.  Start dexilant 60 mg daily.

## 2019-05-10 NOTE — Assessment & Plan Note (Signed)
Followed by psychiatry 

## 2019-05-10 NOTE — Assessment & Plan Note (Signed)
Taking zetia and crestor 40 mg daily. Due for labs today. Denies myalgias.

## 2019-05-10 NOTE — Progress Notes (Signed)
Subjective:   Patient ID: [name], male    DOB: 01/31/1966, 53 y.o.   MRN: [id number]  [name] is a pleasant 53 y.o. year old male who presents to clinic today with Annual Exam (CPE/ pt complains of left side pain last couple months/ around his stomach area ) and Medication Refill (needs refill on zetia and crestor)  on 05/11/2019  HPI:  Health Maintenance  Topic Date Due  . INFLUENZA VACCINE  06/06/2019  . TETANUS/TDAP  02/18/2020  . COLONOSCOPY  02/04/2023  . HIV Screening  Completed   Left sided abdominal pain for a couple of months.  Intermittent, occurs mainly after he eats.  Substernal-epigastric.   Unfortunately protonix stopped working so he was switched back to omeprazole.  At the time, the insurance he had would not cover dexilant.  No black or bloody stools.    Colonoscopy done on 02/03/18 by Dr. Henrene Pastor- 5 year recall.  Has not had an endoscopy.  Reviewed- Anxiety/depression- Has been managed by psychiatry. Currently taking Lamictal, Viibryd with rarely as needed Klonipin and feels it is working well without side effects.  HLD- had been better controlled once he restarted Crestor 40 mg daily (in addition to zetia).  Lab Results  Component Value Date   CHOL 243 (A) 04/14/2018   HDL 44 04/14/2018   LDLCALC 166 04/14/2018   LDLDIRECT 105 (H) 04/28/2018   TRIG 164 (A) 04/14/2018   CHOLHDL 4.8 05/13/2017   Lab Results  Component Value Date   ALT 51 (A) 04/14/2018   AST 24 04/14/2018   ALKPHOS 73 04/14/2018   BILITOT 0.6 05/13/2017   Lab Results  Component Value Date   PSA 1.9 11/24/2014     Current Outpatient Medications on File Prior to Visit  Medication Sig Dispense Refill  . cetirizine (ZYRTEC) 10 MG chewable tablet Chew 10 mg by mouth daily.    . Cholecalciferol (VITAMIN D3 ADULT GUMMIES PO) Take 3 tablets by mouth daily. Gummies    . Cyanocobalamin (VITAMIN B 12 PO) Take 3 tablets by mouth daily. Gummies    . fexofenadine (ALLEGRA) 180 MG  tablet Take 180 mg by mouth daily.    Marland Kitchen lamoTRIgine (LAMICTAL) 200 MG tablet Take 200 mg by mouth daily.    . Multiple Vitamin (MULTIVITAMIN) tablet Take 1 tablet by mouth daily.    Marland Kitchen VIIBRYD 40 MG TABS Take 40 mg by mouth every morning.  2  . clonazePAM (KLONOPIN) 0.5 MG tablet Take 0.5 mg by mouth daily as needed. for anxiety  3   Current Facility-Administered Medications on File Prior to Visit  Medication Dose Route Frequency Provider Last Rate Last Dose  . 0.9 %  sodium chloride infusion  500 mL Intravenous Once Irene Shipper, MD        Allergies  Allergen Reactions  . Bupropion Hcl     REACTION: anxiety  . Fluoxetine Hcl     REACTION: more anxious and tired    Past Medical History:  Diagnosis Date  . ADHD (attention deficit hyperactivity disorder)   . Allergy   . Anxiety   . Depression   . GERD (gastroesophageal reflux disease)   . Hyperlipidemia     Past Surgical History:  Procedure Laterality Date  . [place]   left  . NASAL SINUS SURGERY    . SPINE SURGERY  1999   Lumbar   . VARICOCELE EXCISION  12/2005    Family History  Problem Relation  Age of Onset  . Hyperlipidemia Mother   . Heart attack Mother 53  . Hypertension Brother   . Colon cancer Neg Hx   . Colon polyps Neg Hx   . Esophageal cancer Neg Hx   . Stomach cancer Neg Hx   . Rectal cancer Neg Hx   . Liver cancer Neg Hx   . Pancreatic cancer Neg Hx   . Prostate cancer Neg Hx     Social History   Socioeconomic History  . Marital status: Married    Spouse name: Not on file  . Number of children: Not on file  . Years of education: Not on file  . Highest education level: Not on file  Occupational History  . Occupation: Scientific laboratory technician: [employer]  Social Needs  . Financial resource strain: Not on file  . Food insecurity    Worry: Not on file    Inability: Not on file  . Transportation needs    Medical: Not on file    Non-medical: Not on file  Tobacco  Use  . Smoking status: Never Smoker  . Smokeless tobacco: Current User    Types: Snuff  Substance and Sexual Activity  . Alcohol use: Yes    Comment: occasionally  . Drug use: No  . Sexual activity: Not on file  Lifestyle  . Physical activity    Days per week: Not on file    Minutes per session: Not on file  . Stress: Not on file  Relationships  . Social Herbalist on phone: Not on file    Gets together: Not on file    Attends religious service: Not on file    Active member of club or organization: Not on file    Attends meetings of clubs or organizations: Not on file    Relationship status: Not on file  . Intimate partner violence    Fear of current or ex partner: Not on file    Emotionally abused: Not on file    Physically abused: Not on file    Forced sexual activity: Not on file  Other Topics Concern  . Not on file  Social History Narrative  . Not on file   The PMH, PSH, Social History, Family History, Medications, and allergies have been reviewed in [facility], and have been updated if relevant.   Review of Systems  Constitutional: Negative.   HENT: Negative.   Eyes: Negative.   Respiratory: Negative.   Cardiovascular: Negative.   Gastrointestinal: Positive for abdominal pain. Negative for abdominal distention, anal bleeding, blood in stool, constipation, diarrhea, nausea, rectal pain and vomiting.  Endocrine: Negative.   Genitourinary: Negative.   Musculoskeletal: Negative for arthralgias.  Skin: Negative.   Allergic/Immunologic: Negative.   Neurological: Negative for dizziness, tremors, seizures, syncope, facial asymmetry, speech difficulty, weakness, light-headedness, numbness and headaches.  Hematological: Negative.   Psychiatric/Behavioral: Negative.   All other systems reviewed and are negative.      Objective:    BP 140/88   Pulse 99   Temp 99.1 F (37.3 C) (Oral)   Ht 5\' 10"  (1.778 m)   Wt 211 lb 3.2 oz (95.8 kg)   SpO2 99%   BMI 30.30  kg/m   BP Readings from Last 3 Encounters:  05/11/19 140/88  04/28/18 135/76  02/03/18 (!) 128/98   Wt Readings from Last 3 Encounters:  05/11/19 211 lb 3.2 oz (95.8 kg)  04/28/18 208 lb (94.3 kg)  01/22/18  218 lb (98.9 kg)     Physical Exam  General:  pleasant male in no acute distress Eyes:  PERRL Ears:  External ear exam shows no significant lesions or deformities.  TMs normal bilaterally Hearing is grossly normal bilaterally. Nose:  External nasal examination shows no deformity or inflammation. Nasal mucosa are pink and moist without lesions or exudates. Mouth:  Oral mucosa and oropharynx without lesions or exudates.  Teeth in good repair. Neck:  no carotid bruit or thyromegaly no cervical or supraclavicular lymphadenopathy  Lungs:  Normal respiratory effort, chest expands symmetrically. Lungs are clear to auscultation, no crackles or wheezes. Heart:  Normal rate and regular rhythm. S1 and S2 normal without gallop, murmur, click, rub or other extra sounds. Abdomen:  Bowel sounds positive,abdomen soft and non-tender without masses, organomegaly or hernias noted.  No rebound or guarding but he is tender to palpation in left epigastric area. Pulses:  R and L posterior tibial pulses are full and equal bilaterally  Extremities:  no edema  Psych:  Good eye contact, not anxious or depressed appearing       Assessment & Plan:   Visit for well man health check -   Pure hypercholesterolemia - Plan: Lipid panel, CBC with Differential/Platelet, Comprehensive metabolic panel, TSH,   Anxiety state - Plan:  Depression, recurrent ([place]) - Plan:   Essential hypertension - Plan: CBC with Differential/Platelet, TSH,   B12 deficiency - Plan: B12,   Gastroesophageal reflux disease without esophagitis - Plan: Comprehensive metabolic panel, Ambulatory referral to Gastroenterology,   Screening for malignant neoplasm of prostate - Plan: PSA,   Left sided abdominal pain - Plan: H. pylori  breath test, Ambulatory referral to Gastroenterology,  No follow-ups on file.

## 2019-05-10 NOTE — Assessment & Plan Note (Signed)
Reviewed preventive care protocols, scheduled due services, and updated immunizations Discussed nutrition, exercise, diet, and healthy lifestyle.  

## 2019-05-10 NOTE — Assessment & Plan Note (Signed)
Check B12 today. 

## 2019-05-11 ENCOUNTER — Ambulatory Visit (INDEPENDENT_AMBULATORY_CARE_PROVIDER_SITE_OTHER): Payer: PRIVATE HEALTH INSURANCE | Admitting: Family Medicine

## 2019-05-11 ENCOUNTER — Encounter: Payer: Self-pay | Admitting: Family Medicine

## 2019-05-11 VITALS — BP 140/88 | HR 99 | Temp 99.1°F | Ht 70.0 in | Wt 211.2 lb

## 2019-05-11 DIAGNOSIS — F339 Major depressive disorder, recurrent, unspecified: Secondary | ICD-10-CM | POA: Diagnosis not present

## 2019-05-11 DIAGNOSIS — E7800 Pure hypercholesterolemia, unspecified: Secondary | ICD-10-CM

## 2019-05-11 DIAGNOSIS — E559 Vitamin D deficiency, unspecified: Secondary | ICD-10-CM | POA: Insufficient documentation

## 2019-05-11 DIAGNOSIS — F411 Generalized anxiety disorder: Secondary | ICD-10-CM | POA: Diagnosis not present

## 2019-05-11 DIAGNOSIS — K219 Gastro-esophageal reflux disease without esophagitis: Secondary | ICD-10-CM

## 2019-05-11 DIAGNOSIS — Z0000 Encounter for general adult medical examination without abnormal findings: Secondary | ICD-10-CM

## 2019-05-11 DIAGNOSIS — Z125 Encounter for screening for malignant neoplasm of prostate: Secondary | ICD-10-CM

## 2019-05-11 DIAGNOSIS — I10 Essential (primary) hypertension: Secondary | ICD-10-CM

## 2019-05-11 DIAGNOSIS — R109 Unspecified abdominal pain: Secondary | ICD-10-CM | POA: Insufficient documentation

## 2019-05-11 DIAGNOSIS — E538 Deficiency of other specified B group vitamins: Secondary | ICD-10-CM

## 2019-05-11 MED ORDER — ROSUVASTATIN CALCIUM 40 MG PO TABS: 40.0000 mg | ORAL_TABLET | Freq: Every day | ORAL | 3 refills | Status: DC

## 2019-05-11 MED ORDER — EZETIMIBE 10 MG PO TABS: ORAL_TABLET | ORAL | 1 refills | Status: DC

## 2019-05-11 MED ORDER — DEXLANSOPRAZOLE 60 MG PO CPDR: 60.0000 mg | DELAYED_RELEASE_CAPSULE | Freq: Every day | ORAL | 3 refills | Status: DC

## 2019-05-11 NOTE — Patient Instructions (Addendum)
Great to see you. I will call you with your lab results from today and you can view them online.   We are referring you back to GI for an endoscopy.  Also stop taking omeprazole, start taking dexilant.  Call  GI-  Dr. Hilarie Fredrickson is my favorite.  [phone]- [phone]

## 2019-05-11 NOTE — Assessment & Plan Note (Signed)
Well controlled 

## 2019-05-11 NOTE — Assessment & Plan Note (Addendum)
Unclear etiology but not rebound or guarding on exam. ?PUD- will check llpase, hpylori and additional labs while referring him back to GI for endoscopy.  Also will send in eRx for dexilant and filll out prior authorization if necessary. The patient indicates understanding of these issues and agrees with the plan.

## 2019-05-11 NOTE — Assessment & Plan Note (Signed)
Check vitamin D today. 

## 2019-05-12 ENCOUNTER — Telehealth: Payer: Self-pay

## 2019-05-12 LAB — COMPREHENSIVE METABOLIC PANEL
ALT: 42 IU/L (ref 0–44)
AST: 24 IU/L (ref 0–40)
Albumin/Globulin Ratio: 2.6 — ABNORMAL HIGH (ref 1.2–2.2)
Albumin: 5 g/dL — ABNORMAL HIGH (ref 3.8–4.9)
Alkaline Phosphatase: 68 IU/L (ref 39–117)
BUN/Creatinine Ratio: 13 (ref 9–20)
BUN: 12 mg/dL (ref 6–24)
Bilirubin Total: 0.4 mg/dL (ref 0.0–1.2)
CO2: 23 mmol/L (ref 20–29)
Calcium: 9.7 mg/dL (ref 8.7–10.2)
Chloride: 103 mmol/L (ref 96–106)
Creatinine, Ser: 0.96 mg/dL (ref 0.76–1.27)
GFR calc Af Amer: 105 mL/min/{1.73_m2} (ref >59)
GFR calc non Af Amer: 91 mL/min/{1.73_m2} (ref >59)
Globulin, Total: 1.9 g/dL (ref 1.5–4.5)
Glucose: 104 mg/dL — ABNORMAL HIGH (ref 65–99)
Potassium: 4.4 mmol/L (ref 3.5–5.2)
Sodium: 142 mmol/L (ref 134–144)
Total Protein: 6.9 g/dL (ref 6.0–8.5)

## 2019-05-12 LAB — LIPID PANEL
Chol/HDL Ratio: 4.4 ratio (ref 0.0–5.0)
Cholesterol, Total: 202 mg/dL — ABNORMAL HIGH (ref 100–199)
HDL: 46 mg/dL (ref >39)
LDL Calculated: 137 mg/dL — ABNORMAL HIGH (ref 0–99)
Triglycerides: 93 mg/dL (ref 0–149)
VLDL Cholesterol Cal: 19 mg/dL (ref 5–40)

## 2019-05-12 LAB — CBC WITH DIFFERENTIAL/PLATELET
Basophils Absolute: 0.1 10*3/uL (ref 0.0–0.2)
Basos: 1 %
EOS (ABSOLUTE): 0.3 10*3/uL (ref 0.0–0.4)
Eos: 5 %
Hematocrit: 44.4 % (ref 37.5–51.0)
Hemoglobin: 15 g/dL (ref 13.0–17.7)
Immature Grans (Abs): 0 10*3/uL (ref 0.0–0.1)
Immature Granulocytes: 0 %
Lymphocytes Absolute: 2.1 10*3/uL (ref 0.7–3.1)
Lymphs: 33 %
MCH: 28.1 pg (ref 26.6–33.0)
MCHC: 33.8 g/dL (ref 31.5–35.7)
MCV: 83 fL (ref 79–97)
Monocytes Absolute: 0.6 10*3/uL (ref 0.1–0.9)
Monocytes: 9 %
Neutrophils Absolute: 3.2 10*3/uL (ref 1.4–7.0)
Neutrophils: 52 %
Platelets: 214 10*3/uL (ref 150–450)
RBC: 5.34 x10E6/uL (ref 4.14–5.80)
RDW: 13.7 % (ref 11.6–15.4)
WBC: 6.3 10*3/uL (ref 3.4–10.8)

## 2019-05-12 LAB — VITAMIN B12: Vitamin B-12: 1918 pg/mL — ABNORMAL HIGH (ref 232–1245)

## 2019-05-12 LAB — VITAMIN D 25 HYDROXY (VIT D DEFICIENCY, FRACTURES): Vit D, 25-Hydroxy: 49.8 ng/mL (ref 30.0–100.0)

## 2019-05-12 LAB — TSH: TSH: 1.76 u[IU]/mL (ref 0.450–4.500)

## 2019-05-12 LAB — PSA: Prostate Specific Ag, Serum: 2 ng/mL (ref 0.0–4.0)

## 2019-05-12 LAB — LIPASE: Lipase: 46 U/L (ref 13–78)

## 2019-05-12 NOTE — Telephone Encounter (Signed)
Pt aware no samples of this medication.  Pt states he is not going to pay this price. Pt not sure what he is going to do. $187.00 for 90 day and $80 for 30 day

## 2019-05-12 NOTE — Telephone Encounter (Signed)
Copied from [place] [id number]. Topic: General - Inquiry >> May 11, 2019  4:54 PM Jeri Cos wrote: Reason for CRM: Pt called wanting to know if Dr. Tanja Port has any free samples of the medication he was prescribed (dexlansoprazole (DEXILANT) 60 MG capsule) he can have. Pt feels like the medicine is very expensive. Please call pt back.

## 2019-05-12 NOTE — Telephone Encounter (Signed)
Unable to leave VM, VM full. Will try again. Need to inform pt that we do not have any free samples of medications.

## 2019-05-13 LAB — H. PYLORI BREATH TEST

## 2019-05-13 LAB — H.PYLORI BREATH TEST (REFLEX): H. pylori Breath Test: NEGATIVE

## 2019-05-15 NOTE — Telephone Encounter (Signed)
Dr. Deborra Medina - please advise. Medication is too expensive for pt.

## 2019-05-15 NOTE — Telephone Encounter (Signed)
Is there a coupon/good rx option for this?

## 2019-05-18 NOTE — Telephone Encounter (Signed)
Pt can go to [employer] and click on savings. Then he just needs to fill out his information & he will get a savings card to use.

## 2019-05-26 NOTE — Telephone Encounter (Signed)
Sent pt a message advising to go to the [facility] site to get a copay card/thx dmf

## 2019-06-08 ENCOUNTER — Ambulatory Visit: Payer: PRIVATE HEALTH INSURANCE | Admitting: Internal Medicine

## 2019-10-17 ENCOUNTER — Other Ambulatory Visit: Payer: Self-pay

## 2019-10-17 DIAGNOSIS — Z20822 Contact with and (suspected) exposure to covid-19: Secondary | ICD-10-CM

## 2019-10-18 LAB — NOVEL CORONAVIRUS, NAA: SARS-CoV-2, NAA: NOT DETECTED

## 2019-12-07 NOTE — Progress Notes (Signed)
Virtual Visit via Video   Due to the COVID-19 pandemic, this visit was completed with telemedicine (audio/video) technology to reduce patient and provider exposure as well as to preserve personal protective equipment.   I connected with [name] by a video enabled telemedicine application and verified that I am speaking with the correct person using two identifiers. Location patient: Home Location provider: [facility], Office Persons participating in the virtual visit: GEOVONNIE KOONZ, Arnette Norris, MD   I discussed the limitations of evaluation and management by telemedicine and the availability of in person appointments. The patient expressed understanding and agreed to proceed.  Care Team   Patient Care Team: Lucille Passy, MD as PCP - General (Family Medicine) Pyrtle, Lajuan Lines, MD as Consulting Physician (Gastroenterology)  Subjective:   HPI: Patient is connecting today to discuss medications.    See problem based charting.   Review of Systems  Constitutional: Negative for fever and malaise/fatigue.  HENT: Negative for congestion and hearing loss.   Eyes: Negative for blurred vision, discharge and redness.  Respiratory: Negative for cough and shortness of breath.   Cardiovascular: Negative for chest pain, palpitations and leg swelling.  Gastrointestinal: Negative for abdominal pain and heartburn.  Genitourinary: Negative for dysuria.  Musculoskeletal: Negative for falls.  Skin: Negative for rash.  Neurological: Negative for loss of consciousness and headaches.  Endo/Heme/Allergies: Does not bruise/bleed easily.  Psychiatric/Behavioral: Negative for depression.  All other systems reviewed and are negative.    Patient Active Problem List   Diagnosis Date Noted  . Left sided abdominal pain 05/11/2019  . Vitamin D deficiency 05/11/2019  . B12 deficiency 04/28/2018  . Numbness and tingling in right hand 04/28/2018  . Visit for well man health check 05/13/2017  .  GERD (gastroesophageal reflux disease) 11/24/2014  . HTN (hypertension) 01/22/2013  . Depression, recurrent ([place]) 01/19/2011  . 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 HEADACHE 01/04/2011  . ALLERGIC RHINITIS 07/03/2007  . HLD (hyperlipidemia) 06/19/2007  . Anxiety state 05/12/2007    Social History   Tobacco Use  . Smoking status: Never Smoker  . Smokeless tobacco: Current User    Types: Snuff  Substance Use Topics  . Alcohol use: Yes    Comment: occasionally    Current Outpatient Medications:  .  cetirizine (ZYRTEC) 10 MG chewable tablet, Chew 10 mg by mouth daily., Disp: , Rfl:  .  Cholecalciferol (VITAMIN D3 ADULT GUMMIES PO), Take 3 tablets by mouth daily. Gummies, Disp: , Rfl:  .  Cyanocobalamin (VITAMIN B 12 PO), Take 3 tablets by mouth daily. Gummies, Disp: , Rfl:  .  ezetimibe (ZETIA) 10 MG tablet, 1 tablet by mouth daily., Disp: 90 tablet, Rfl: 3 .  fexofenadine (ALLEGRA) 180 MG tablet, Take 180 mg by mouth daily., Disp: , Rfl:  .  lamoTRIgine (LAMICTAL) 100 MG tablet, Take 1/2-1bid, Disp: 120 tablet, Rfl: 11 .  Multiple Vitamin (MULTIVITAMIN) tablet, Take 1 tablet by mouth daily., Disp: , Rfl:  .  omeprazole (PRILOSEC) 40 MG capsule, Take 40 mg by mouth 2 (two) times daily., Disp: , Rfl:  .  rosuvastatin (CRESTOR) 40 MG tablet, Take 1 tablet (40 mg total) by mouth daily., Disp: 90 tablet, Rfl: 3 .  VIIBRYD 40 MG TABS, Take 1 tablet (40 mg total) by mouth every morning., Disp: 30 tablet, Rfl: 11  Allergies  Allergen Reactions  . Bupropion Hcl     REACTION: anxiety  . Fluoxetine Hcl     REACTION: more anxious and  tired    Objective:   vitals were not taken for this visit.    VITALS: Per patient if applicable, see vitals. GENERAL: Alert, appears well and in no acute distress. HEENT: Atraumatic, conjunctiva clear, no obvious abnormalities on inspection of external nose and ears. NECK: Normal movements of the head and neck. CARDIOPULMONARY: No increased WOB. Speaking in  clear sentences. I:E ratio WNL.  MS: Moves all visible extremities without noticeable abnormality. PSYCH: Pleasant and cooperative, well-groomed. Speech normal rate and rhythm. Affect is appropriate. Insight and judgement are appropriate. Attention is focused, linear, and appropriate.  NEURO: CN grossly intact. Oriented as arrived to appointment on time with no prompting. Moves both UE equally.  SKIN: No obvious lesions, wounds, erythema, or cyanosis noted on face or hands.  Depression screen PHQ 2/9 12/10/2019  Decreased Interest 0  Down, Depressed, Hopeless 0  PHQ - 2 Score 0     . COVID-19 Education: The signs and symptoms of COVID-19 were discussed with the patient and how to seek care for testing if needed. The importance of social distancing was discussed today. . Reviewed expectations re: course of current medical issues. . Discussed self-management of symptoms. . Outlined signs and symptoms indicating need for more acute intervention. . Patient verbalized understanding and all questions were answered. Marland Kitchen Health Maintenance issues including appropriate healthy diet, exercise, and smoking avoidance were discussed with patient. . See orders for this visit as documented in the electronic medical record.  Arnette Norris, MD  Records requested if needed. Time spent: 15 minutes, of which >50% was spent in obtaining information about his symptoms, reviewing his previous labs, evaluations, and treatments, counseling him about his condition (please see the discussed topics above), and developing a plan to further investigate it; he had a number of questions which I addressed.   Lab Results  Component Value Date   WBC 6.3 05/11/2019   HGB 15.0 05/11/2019   HCT 44.4 05/11/2019   PLT 214 05/11/2019   GLUCOSE 104 (H) 05/11/2019   CHOL 202 (H) 05/11/2019   TRIG 93 05/11/2019   HDL 46 05/11/2019   LDLDIRECT 105 (H) 04/28/2018   LDLCALC 137 (H) 05/11/2019   ALT 42 05/11/2019   AST 24 05/11/2019    NA 142 05/11/2019   K 4.4 05/11/2019   CL 103 05/11/2019   CREATININE 0.96 05/11/2019   BUN 12 05/11/2019   CO2 23 05/11/2019   TSH 1.760 05/11/2019   PSA 1.9 11/24/2014   HGBA1C 5.2 04/14/2018    Lab Results  Component Value Date   TSH 1.760 05/11/2019   Lab Results  Component Value Date   WBC 6.3 05/11/2019   HGB 15.0 05/11/2019   HCT 44.4 05/11/2019   MCV 83 05/11/2019   PLT 214 05/11/2019   Lab Results  Component Value Date   NA 142 05/11/2019   K 4.4 05/11/2019   CO2 23 05/11/2019   GLUCOSE 104 (H) 05/11/2019   BUN 12 05/11/2019   CREATININE 0.96 05/11/2019   BILITOT 0.4 05/11/2019   ALKPHOS 68 05/11/2019   AST 24 05/11/2019   ALT 42 05/11/2019   PROT 6.9 05/11/2019   ALBUMIN 5.0 (H) 05/11/2019   CALCIUM 9.7 05/11/2019   GFR 74.68 07/02/2014   Lab Results  Component Value Date   CHOL 202 (H) 05/11/2019   Lab Results  Component Value Date   HDL 46 05/11/2019   Lab Results  Component Value Date   LDLCALC 137 (H) 05/11/2019   Lab  Results  Component Value Date   TRIG 93 05/11/2019   Lab Results  Component Value Date   CHOLHDL 4.4 05/11/2019   Lab Results  Component Value Date   HGBA1C 5.2 04/14/2018       Assessment & Plan:   Problem List Items Addressed This Visit      Active Problems   HLD (hyperlipidemia) - Primary    Has been controlled on current regimen. LDL is at goal on current statin dose, patient has no side effects from medication and LFTS are normal. No changes today.- due for labs. Taking Crestor 40 mg daily and Zetia 10 mg daily.  Lab Results  Component Value Date   CHOL 202 (H) 05/11/2019   HDL 46 05/11/2019   LDLCALC 137 (H) 05/11/2019   LDLDIRECT 105 (H) 04/28/2018   TRIG 93 05/11/2019   CHOLHDL 4.4 05/11/2019   He is just needing refills on his current medications and is stable. He will be transferring care to Dr. Einar P[facility]asant at [facility].   He will schedule his labs to be done at [facility] soon. Maintenance  medication sent to pharmacy.        Relevant Medications   ezetimibe (ZETIA) 10 MG tablet   Other Relevant Orders   CBC with Differential/Platelet   Comprehensive metabolic panel   Lipid panel   HgB A1c   Depression, recurrent ([facility])    Has been managed by psychiatry. Currently taking Lamictal, Viibryd with rarely as needed Klonipin and feels it is working well without side effects.      Relevant Medications   VIIBRYD 40 MG TABS   HTN (hypertension)   Relevant Medications   ezetimibe (ZETIA) 10 MG tablet   Other Relevant Orders   HgB A1c   GERD (gastroesophageal reflux disease)    History:  Compliant with dexilant.  Gastrointestinal ROS: no abdominal pain, change in bowel habits, or black or bloody stools. Medication compliance: taking as prescribed. Assessment/Plan: We reviewed the diagnosis of GERD and the reasons why it was important to treat this. We discussed "red flag" symptoms and the importance of follow up if symptoms persisted despite treatment. We reviewed non-pharmacologic management of GERD symptoms: including: caffeine reduction, dietary changes, elevate HOB, NPO after supper, reduction of alcohol intake, tobacco cessation, and weight loss.          Relevant Medications   omeprazole (PRILOSEC) 40 MG capsule    Other Visit Diagnoses    Screening for malignant neoplasm of prostate       Relevant Orders   PSA      I have discontinued Crissie Sickles. Hence's clonazePAM and dexlansoprazole. I have also changed his Viibryd. Additionally, I am having him maintain his multivitamin, cetirizine, fexofenadine, Cholecalciferol (VITAMIN D3 ADULT GUMMIES PO), Cyanocobalamin (VITAMIN B 12 PO), rosuvastatin, omeprazole, ezetimibe, and lamoTRIgine. We will stop administering sodium chloride.  Meds ordered this encounter  Medications  . ezetimibe (ZETIA) 10 MG tablet    Sig: 1 tablet by mouth daily.    Dispense:  90 tablet    Refill:  3  . VIIBRYD 40 MG TABS    Sig: Take  1 tablet (40 mg total) by mouth every morning.    Dispense:  30 tablet    Refill:  11  . lamoTRIgine (LAMICTAL) 100 MG tablet    Sig: Take 1/2-1bid    Dispense:  120 tablet    Refill:  11     Arnette Norris, MD

## 2019-12-09 NOTE — Assessment & Plan Note (Signed)
History:  Compliant with dexilant.  Gastrointestinal ROS: no abdominal pain, change in bowel habits, or black or bloody stools. Medication compliance: taking as prescribed. Assessment/Plan: We reviewed the diagnosis of GERD and the reasons why it was important to treat this. We discussed "red flag" symptoms and the importance of follow up if symptoms persisted despite treatment. We reviewed non-pharmacologic management of GERD symptoms: including: caffeine reduction, dietary changes, elevate HOB, NPO after supper, reduction of alcohol intake, tobacco cessation, and weight loss.

## 2019-12-09 NOTE — Assessment & Plan Note (Signed)
Has been managed by psychiatry. Currently taking Lamictal, Viibryd with rarely as needed Klonipin and feels it is working well without side effects.

## 2019-12-09 NOTE — Assessment & Plan Note (Addendum)
Has been controlled on current regimen. LDL is at goal on current statin dose, patient has no side effects from medication and LFTS are normal. No changes today.- due for labs. Taking Crestor 40 mg daily and Zetia 10 mg daily.  Lab Results  Component Value Date   CHOL 202 (H) 05/11/2019   HDL 46 05/11/2019   LDLCALC 137 (H) 05/11/2019   LDLDIRECT 105 (H) 04/28/2018   TRIG 93 05/11/2019   CHOLHDL 4.4 05/11/2019   He is just needing refills on his current medications and is stable. He will be transferring care to Dr. Einar Pheasant at [facility].   He will schedule his labs to be done at [facility] soon. Maintenance medication sent to pharmacy.

## 2019-12-10 ENCOUNTER — Telehealth (INDEPENDENT_AMBULATORY_CARE_PROVIDER_SITE_OTHER): Payer: PRIVATE HEALTH INSURANCE | Admitting: Family Medicine

## 2019-12-10 ENCOUNTER — Encounter: Payer: Self-pay | Admitting: Family Medicine

## 2019-12-10 ENCOUNTER — Other Ambulatory Visit: Payer: Self-pay | Admitting: Family Medicine

## 2019-12-10 DIAGNOSIS — K219 Gastro-esophageal reflux disease without esophagitis: Secondary | ICD-10-CM | POA: Diagnosis not present

## 2019-12-10 DIAGNOSIS — E7800 Pure hypercholesterolemia, unspecified: Secondary | ICD-10-CM

## 2019-12-10 DIAGNOSIS — F339 Major depressive disorder, recurrent, unspecified: Secondary | ICD-10-CM

## 2019-12-10 DIAGNOSIS — I10 Essential (primary) hypertension: Secondary | ICD-10-CM

## 2019-12-10 DIAGNOSIS — Z125 Encounter for screening for malignant neoplasm of prostate: Secondary | ICD-10-CM

## 2019-12-10 LAB — HEPATIC FUNCTION PANEL
ALT: 54 — AB (ref 10–40)
AST: 25 (ref 14–40)
Alkaline Phosphatase: 81 (ref 25–125)
Bilirubin, Total: 0.6

## 2019-12-10 LAB — LIPID PANEL
Cholesterol: 204 — AB (ref 0–200)
HDL: 38 (ref 35–70)
LDL Cholesterol: 143
LDl/HDL Ratio: 5.4
Triglycerides: 127 (ref 40–160)

## 2019-12-10 LAB — CBC AND DIFFERENTIAL
HCT: 46 (ref 41–53)
Hemoglobin: 15.8 (ref 13.5–17.5)
Neutrophils Absolute: 5
Platelets: 219 (ref 150–399)
WBC: 7.8

## 2019-12-10 LAB — COMPREHENSIVE METABOLIC PANEL
Albumin: 5.2 — AB (ref 3.5–5.0)
Calcium: 9.8 (ref 8.7–10.7)
GFR calc Af Amer: 73
GFR calc non Af Amer: 63
Globulin: 2.1

## 2019-12-10 LAB — HEMOGLOBIN A1C: Hemoglobin A1C: 5.5

## 2019-12-10 LAB — BASIC METABOLIC PANEL
BUN: 18 (ref 4–21)
CO2: 25 — AB (ref 13–22)
Chloride: 102 (ref 99–108)
Creatinine: 1.3 (ref 0.6–1.3)
Glucose: 111
Potassium: 5 (ref 3.4–5.3)
Sodium: 141 (ref 137–147)

## 2019-12-10 LAB — PSA: PSA: 1.5

## 2019-12-10 LAB — CBC: RBC: 5.62 — AB (ref 3.87–5.11)

## 2019-12-10 MED ORDER — LAMOTRIGINE 100 MG PO TABS: ORAL_TABLET | ORAL | 11 refills | Status: DC

## 2019-12-10 MED ORDER — VIIBRYD 40 MG PO TABS: 40.0000 mg | ORAL_TABLET | Freq: Every morning | ORAL | 11 refills | Status: DC

## 2019-12-10 MED ORDER — EZETIMIBE 10 MG PO TABS: ORAL_TABLET | ORAL | 3 refills | Status: DC

## 2019-12-11 ENCOUNTER — Encounter: Payer: Self-pay | Admitting: Family Medicine

## 2019-12-11 ENCOUNTER — Telehealth: Payer: Self-pay | Admitting: Family Medicine

## 2019-12-11 LAB — CBC WITH DIFFERENTIAL/PLATELET
Basophils Absolute: 0.1 10*3/uL (ref 0.0–0.2)
Basos: 1 %
EOS (ABSOLUTE): 0.2 10*3/uL (ref 0.0–0.4)
Eos: 3 %
Hematocrit: 46.4 % (ref 37.5–51.0)
Hemoglobin: 15.8 g/dL (ref 13.0–17.7)
Immature Grans (Abs): 0 10*3/uL (ref 0.0–0.1)
Immature Granulocytes: 0 %
Lymphocytes Absolute: 2 10*3/uL (ref 0.7–3.1)
Lymphs: 26 %
MCH: 28.1 pg (ref 26.6–33.0)
MCHC: 34.1 g/dL (ref 31.5–35.7)
MCV: 83 fL (ref 79–97)
Monocytes Absolute: 0.6 10*3/uL (ref 0.1–0.9)
Monocytes: 7 %
Neutrophils Absolute: 4.9 10*3/uL (ref 1.4–7.0)
Neutrophils: 63 %
Platelets: 219 10*3/uL (ref 150–450)
RBC: 5.62 x10E6/uL (ref 4.14–5.80)
RDW: 14 % (ref 11.6–15.4)
WBC: 7.8 10*3/uL (ref 3.4–10.8)

## 2019-12-11 LAB — COMPREHENSIVE METABOLIC PANEL
ALT: 54 IU/L — ABNORMAL HIGH (ref 0–44)
AST: 25 IU/L (ref 0–40)
Albumin/Globulin Ratio: 2.5 — ABNORMAL HIGH (ref 1.2–2.2)
Albumin: 5.2 g/dL — ABNORMAL HIGH (ref 3.8–4.9)
Alkaline Phosphatase: 81 IU/L (ref 39–117)
BUN/Creatinine Ratio: 14 (ref 9–20)
BUN: 18 mg/dL (ref 6–24)
Bilirubin Total: 0.6 mg/dL (ref 0.0–1.2)
CO2: 25 mmol/L (ref 20–29)
Calcium: 9.8 mg/dL (ref 8.7–10.2)
Chloride: 102 mmol/L (ref 96–106)
Creatinine, Ser: 1.3 mg/dL — ABNORMAL HIGH (ref 0.76–1.27)
GFR calc Af Amer: 73 mL/min/{1.73_m2} (ref >59)
GFR calc non Af Amer: 63 mL/min/{1.73_m2} (ref >59)
Globulin, Total: 2.1 g/dL (ref 1.5–4.5)
Glucose: 111 mg/dL — ABNORMAL HIGH (ref 65–99)
Potassium: 5 mmol/L (ref 3.5–5.2)
Sodium: 141 mmol/L (ref 134–144)
Total Protein: 7.3 g/dL (ref 6.0–8.5)

## 2019-12-11 LAB — HEMOGLOBIN A1C
Est. average glucose Bld gHb Est-mCnc: 111 mg/dL
Hgb A1c MFr Bld: 5.5 % (ref 4.8–5.6)

## 2019-12-11 LAB — LIPID PANEL
Chol/HDL Ratio: 5.4 ratio — ABNORMAL HIGH (ref 0.0–5.0)
Cholesterol, Total: 204 mg/dL — ABNORMAL HIGH (ref 100–199)
HDL: 38 mg/dL — ABNORMAL LOW (ref >39)
LDL Chol Calc (NIH): 143 mg/dL — ABNORMAL HIGH (ref 0–99)
Triglycerides: 127 mg/dL (ref 0–149)
VLDL Cholesterol Cal: 23 mg/dL (ref 5–40)

## 2019-12-11 LAB — PSA: Prostate Specific Ag, Serum: 1.5 ng/mL (ref 0.0–4.0)

## 2019-12-11 NOTE — Progress Notes (Signed)
[facility]/thx dmf

## 2019-12-11 NOTE — Telephone Encounter (Signed)
Left message pt to call office  Pt wanted toc appointment with dr cody and cpx.  Dr Einar Pheasant wanted to schedule [facility] appointment and if time allows she may do cpx.  Cannot quarantee cpx will be done at visit

## 2019-12-12 ENCOUNTER — Encounter: Payer: Self-pay | Admitting: Family Medicine

## 2019-12-14 NOTE — Telephone Encounter (Signed)
Appointment 4/6

## 2020-02-09 ENCOUNTER — Ambulatory Visit (INDEPENDENT_AMBULATORY_CARE_PROVIDER_SITE_OTHER): Payer: POS | Admitting: Family Medicine

## 2020-02-09 ENCOUNTER — Other Ambulatory Visit: Payer: Self-pay

## 2020-02-09 ENCOUNTER — Encounter: Payer: Self-pay | Admitting: Family Medicine

## 2020-02-09 VITALS — BP 132/88 | HR 104 | Temp 98.8°F | Resp 18 | Ht 69.5 in | Wt 221.8 lb

## 2020-02-09 DIAGNOSIS — R4586 Emotional lability: Secondary | ICD-10-CM | POA: Diagnosis not present

## 2020-02-09 DIAGNOSIS — J302 Other seasonal allergic rhinitis: Secondary | ICD-10-CM | POA: Insufficient documentation

## 2020-02-09 DIAGNOSIS — E7800 Pure hypercholesterolemia, unspecified: Secondary | ICD-10-CM

## 2020-02-09 DIAGNOSIS — Z720 Tobacco use: Secondary | ICD-10-CM | POA: Diagnosis not present

## 2020-02-09 DIAGNOSIS — F339 Major depressive disorder, recurrent, unspecified: Secondary | ICD-10-CM

## 2020-02-09 DIAGNOSIS — K219 Gastro-esophageal reflux disease without esophagitis: Secondary | ICD-10-CM

## 2020-02-09 DIAGNOSIS — F411 Generalized anxiety disorder: Secondary | ICD-10-CM

## 2020-02-09 MED ORDER — LAMOTRIGINE 100 MG PO TABS: ORAL_TABLET | ORAL | 3 refills | Status: DC

## 2020-02-09 MED ORDER — MONTELUKAST SODIUM 10 MG PO TABS: 10.0000 mg | ORAL_TABLET | Freq: Every day | ORAL | 3 refills | Status: DC

## 2020-02-09 MED ORDER — OMEPRAZOLE 40 MG PO CPDR: 40.0000 mg | DELAYED_RELEASE_CAPSULE | Freq: Two times a day (BID) | ORAL | 3 refills | Status: DC

## 2020-02-09 MED ORDER — ROSUVASTATIN CALCIUM 40 MG PO TABS: 40.0000 mg | ORAL_TABLET | Freq: Every day | ORAL | 3 refills | Status: DC

## 2020-02-09 NOTE — Assessment & Plan Note (Signed)
Stable on current treatment. Doing well. Continue lamictal and Viibryd

## 2020-02-09 NOTE — Patient Instructions (Signed)
Great to meet you!  1) Call the GI doctor to talk about Heartburn  2) Start Singulair for allergies  3) Return in 3 months for annual

## 2020-02-09 NOTE — Progress Notes (Signed)
Subjective:     [name] is a 54 y.o. male presenting for Transfer of Care (from Dr Deborra Medina) and Medication Management (refills)     HPI  #Anxiety/Depression - taking lamictal - saw psychiatry - has been stable for years - also on Viibryd which help  #GERD - taking omeprazole  - never saw GI  - takes 40 mg daily and then prn in the afternoon   #allergies - oak, ink dust, paper dust - claritin and zyrtec - flonase - no improvement with allergy shots in the past   Review of Systems   Social History   Tobacco Use  Smoking Status Never Smoker  Smokeless Tobacco Current User  . Types: Snuff        Objective:    BP Readings from Last 3 Encounters:  02/09/20 132/88  05/11/19 140/88  04/28/18 135/76   Wt Readings from Last 3 Encounters:  02/09/20 221 lb 12 oz (100.6 kg)  05/11/19 211 lb 3.2 oz (95.8 kg)  04/28/18 208 lb (94.3 kg)    BP 132/88   Pulse (!) 104   Temp 98.8 F (37.1 C)   Resp 18   Ht 5' 9.5" (1.765 m)   Wt 221 lb 12 oz (100.6 kg)   SpO2 96%   BMI 32.28 kg/m    Physical Exam Constitutional:      Appearance: Normal appearance. He is not ill-appearing or diaphoretic.  HENT:     Right Ear: External ear normal.     Left Ear: External ear normal.     Nose: Nose normal.  Eyes:     General: No scleral icterus.    Extraocular Movements: Extraocular movements intact.     Conjunctiva/sclera: Conjunctivae normal.  Cardiovascular:     Rate and Rhythm: Normal rate.  Pulmonary:     Effort: Pulmonary effort is normal.  Musculoskeletal:     Cervical back: Neck supple.  Skin:    General: Skin is warm and dry.  Neurological:     Mental Status: He is alert. Mental status is at baseline.  Psychiatric:        Mood and Affect: Mood normal.           Assessment & Plan:   Problem List Items Addressed This Visit      Digestive   GERD (gastroesophageal reflux disease)    Discussed that given duration of being on medication and  snuff use would recommend GI evaluation to rule out other causes for persistent symptoms. He will continue omeprazole but reach out GI (had colonoscopy done last year) for GERD f/u      Relevant Medications   omeprazole (PRILOSEC) 40 MG capsule     Other   HLD (hyperlipidemia)    LDL was elevated in February. Will repeat in July, if still elevated will need to consider change in medication. Continue crestor for now.       Relevant Medications   rosuvastatin (CRESTOR) 40 MG tablet   Anxiety state - Primary   Relevant Medications   lamoTRIgine (LAMICTAL) 100 MG tablet   Depression, recurrent (HCC)    Stable on current treatment. Doing well. Continue lamictal and Viibryd      Relevant Medications   lamoTRIgine (LAMICTAL) 100 MG tablet   Mood swings   Relevant Medications   lamoTRIgine (LAMICTAL) 100 MG tablet   Chewing tobacco use   Seasonal allergies    Failed allergy shots. Will try singulair. Continue flonase  Relevant Medications   montelukast (SINGULAIR) 10 MG tablet       Return in about 3 months (around 05/10/2020).  Lesleigh Noe, MD

## 2020-02-09 NOTE — Assessment & Plan Note (Signed)
Failed allergy shots. Will try singulair. Continue flonase

## 2020-02-09 NOTE — Assessment & Plan Note (Signed)
LDL was elevated in February. Will repeat in July, if still elevated will need to consider change in medication. Continue crestor for now.

## 2020-02-09 NOTE — Assessment & Plan Note (Signed)
Discussed that given duration of being on medication and snuff use would recommend GI evaluation to rule out other causes for persistent symptoms. He will continue omeprazole but reach out GI (had colonoscopy done last year) for GERD f/u

## 2020-02-16 ENCOUNTER — Telehealth: Payer: Self-pay | Admitting: Family Medicine

## 2020-02-16 NOTE — Telephone Encounter (Signed)
Called patient to discuss medications and risk for psychiatric side effects to Montelukast. Left voicemail to call back.   Will also try again tomorrow.

## 2020-02-18 NOTE — Telephone Encounter (Signed)
Discussed Black Box warning of singular with depression medication.   Pt notes singular already helping with allergies and will be on the look out for worsening depression symptoms and stop immediately if any occur.

## 2020-03-17 ENCOUNTER — Ambulatory Visit: Payer: POS | Admitting: Internal Medicine

## 2020-03-21 ENCOUNTER — Ambulatory Visit: Payer: POS | Admitting: Internal Medicine

## 2020-03-21 ENCOUNTER — Encounter: Payer: Self-pay | Admitting: Internal Medicine

## 2020-03-21 VITALS — BP 136/80 | HR 92 | Temp 98.1°F | Ht 69.5 in | Wt 224.6 lb

## 2020-03-21 DIAGNOSIS — Z01818 Encounter for other preprocedural examination: Secondary | ICD-10-CM

## 2020-03-21 DIAGNOSIS — R1310 Dysphagia, unspecified: Secondary | ICD-10-CM | POA: Diagnosis not present

## 2020-03-21 DIAGNOSIS — K219 Gastro-esophageal reflux disease without esophagitis: Secondary | ICD-10-CM | POA: Diagnosis not present

## 2020-03-21 NOTE — Progress Notes (Signed)
HISTORY OF PRESENT ILLNESS:  [name] is a 54 y.o. male, [employer] employee, with a history of sessile serrated polyps on colonoscopy April 2019.  He presents today at the urging of his primary care provider regarding chronic GERD with dyspeptic symptoms and complaints of increased intestinal gas.  Patient reports 10-year history of problems with reflux as manifested by pyrosis and regurgitation.  He has been on some type of PPI for the most part.  This helps.  He also is plagued with increased intestinal gas as manifested by belching and excessive flatus.  He describes bloating.  Currently on omeprazole 40 mg daily.  He also describes intermittent solid food dysphagia.  He has not had prior upper endoscopy.  He has gained 20 pounds over the past year.  Review of blood work from February 2021 shows mildly elevated ALT of 54.  Normal hemoglobin 15.8.  No relevant x-ray abnormalities.  He has not been vaccinated to Covid  REVIEW OF SYSTEMS:  All non-GI ROS negative unless otherwise stated in the HPI except for anxiety/depression  Past Medical History:  Diagnosis Date  . ADHD (attention deficit hyperactivity disorder)   . Allergy   . Anxiety   . Depression   . GERD (gastroesophageal reflux disease)   . Hyperlipidemia     Past Surgical History:  Procedure Laterality Date  . CARPAL TUNNEL RELEASE  12/2018  . [place]   left  . NASAL SINUS SURGERY    . SPINE SURGERY  1999   [place]   . VARICOCELE EXCISION  12/2005    Social History [name]  reports that he has never smoked. His smokeless tobacco use includes snuff. He reports current alcohol use. He reports that he does not use drugs.  family history includes Heart attack (age of onset: 51) in his mother; Hyperlipidemia in his mother; Hypertension in his brother.  Allergies  Allergen Reactions  . Bupropion Hcl     REACTION: anxiety  . Fluoxetine Hcl     REACTION: more anxious and tired        PHYSICAL EXAMINATION: Vital signs: BP 136/80   Pulse 92   Temp 98.1 F (36.7 C)   Ht 5' 9.5" (1.765 m)   Wt 224 lb 9.6 oz (101.9 kg)   BMI 32.69 kg/m   Constitutional: generally well-appearing, no acute distress Psychiatric: alert and oriented x3, cooperative Eyes: extraocular movements intact, anicteric, conjunctiva pink Mouth: oral pharynx moist, no lesions Neck: supple no lymphadenopathy Cardiovascular: heart regular rate and rhythm, no murmur Lungs: clear to auscultation bilaterally Abdomen: soft, nontender, nondistended, no obvious ascites, no peritoneal signs, normal bowel sounds, no organomegaly Rectal: Omitted Extremities: no clubbing, cyanosis, or lower extremity edema bilaterally Skin: no lesions on visible extremities Neuro: No focal deficits.  Cranial nerves intact  ASSESSMENT:  1.  Chronic GERD. 2.  Intermittent solid food dysphagia.  Rule out peptic stricture 3.  Increased intestinal gas as manifested by bloating, flatus, and belching 4.  Obesity with 20 pound weight gain over the past year 4.  History of sessile serrated polyps on colonoscopy April 2019   PLAN:  1.  Reflux precautions with attention to weight loss 2.  Continue omeprazole 40 mg daily 3.  Schedule upper endoscopy with possible esophageal dilation.The nature of the procedure, as well as the risks, benefits, and alternatives were carefully and thoroughly reviewed with the patient. Ample time for discussion and questions allowed. The patient understood, was satisfied, and agreed to  proceed. 4.  Consider testing for Helicobacter pylori and/or course of probiotic therapy 5.  Surveillance colonoscopy around April 2024

## 2020-03-21 NOTE — Patient Instructions (Addendum)
If you are age 53 or older, your body mass index should be between 23-30. Your Body mass index is 32.69 kg/m. If this is out of the aforementioned range listed, please consider follow up with your Primary Care Provider.  If you are age 53 or younger, your body mass index should be between 19-25. Your Body mass index is 32.69 kg/m. If this is out of the aformentioned range listed, please consider follow up with your Primary Care Provider.   You have been scheduled for an endoscopy. Please follow written instructions given to you at your visit today. If you use inhalers (even only as needed), please bring them with you on the day of your procedure.  It was a pleasure to see you today!  Dr. Scarlette Shorts     Gastroesophageal Reflux Disease, Adult Gastroesophageal reflux (GER) happens when acid from the stomach flows up into the tube that connects the mouth and the stomach (esophagus). Normally, food travels down the esophagus and stays in the stomach to be digested. With GER, food and stomach acid sometimes move back up into the esophagus. You may have a disease called gastroesophageal reflux disease (GERD) if the reflux:  Happens often.  Causes frequent or very bad symptoms.  Causes problems such as damage to the esophagus. When this happens, the esophagus becomes sore and swollen (inflamed). Over time, GERD can make small holes (ulcers) in the lining of the esophagus. What are the causes? This condition is caused by a problem with the muscle between the esophagus and the stomach. When this muscle is weak or not normal, it does not close properly to keep food and acid from coming back up from the stomach. The muscle can be weak because of:  Tobacco use.  Pregnancy.  Having a certain type of hernia (hiatal hernia).  Alcohol use.  Certain foods and drinks, such as coffee, chocolate, onions, and peppermint. What increases the risk? You are more likely to develop this condition if  you:  Are overweight.  Have a disease that affects your connective tissue.  Use NSAID medicines. What are the signs or symptoms? Symptoms of this condition include:  Heartburn.  Difficult or painful swallowing.  The feeling of having a lump in the throat.  A bitter taste in the mouth.  Bad breath.  Having a lot of saliva.  Having an upset or bloated stomach.  Belching.  Chest pain. Different conditions can cause chest pain. Make sure you see your doctor if you have chest pain.  Shortness of breath or noisy breathing (wheezing).  Ongoing (chronic) cough or a cough at night.  Wearing away of the surface of teeth (tooth enamel).  Weight loss. How is this treated? Treatment will depend on how bad your symptoms are. Your doctor may suggest:  Changes to your diet.  Medicine.  Surgery. Follow these instructions at home: Eating and drinking   Follow a diet as told by your doctor. You may need to avoid foods and drinks such as: ? Coffee and tea (with or without caffeine). ? Drinks that contain alcohol. ? Energy drinks and sports drinks. ? Bubbly (carbonated) drinks or sodas. ? Chocolate and cocoa. ? Peppermint and mint flavorings. ? Garlic and onions. ? Horseradish. ? Spicy and acidic foods. These include peppers, chili powder, curry powder, vinegar, hot sauces, and BBQ sauce. ? Citrus fruit juices and citrus fruits, such as oranges, lemons, and limes. ? Tomato-based foods. These include red sauce, chili, salsa, and pizza with red sauce. ?  Fried and fatty foods. These include donuts, french fries, potato chips, and high-fat dressings. ? High-fat meats. These include hot dogs, rib eye steak, sausage, ham, and bacon. ? High-fat dairy items, such as whole milk, butter, and cream cheese.  Eat small meals often. Avoid eating large meals.  Avoid drinking large amounts of liquid with your meals.  Avoid eating meals during the 2-3 hours before bedtime.  Avoid lying  down right after you eat.  Do not exercise right after you eat. Lifestyle   Do not use any products that contain nicotine or tobacco. These include cigarettes, e-cigarettes, and chewing tobacco. If you need help quitting, ask your doctor.  Try to lower your stress. If you need help doing this, ask your doctor.  If you are overweight, lose an amount of weight that is healthy for you. Ask your doctor about a safe weight loss goal. General instructions  Pay attention to any changes in your symptoms.  Take over-the-counter and prescription medicines only as told by your doctor. Do not take aspirin, ibuprofen, or other NSAIDs unless your doctor says it is okay.  Wear loose clothes. Do not wear anything tight around your waist.  Raise (elevate) the head of your bed about 6 inches (15 cm).  Avoid bending over if this makes your symptoms worse.  Keep all follow-up visits as told by your doctor. This is important. Contact a doctor if:  You have new symptoms.  You lose weight and you do not know why.  You have trouble swallowing or it hurts to swallow.  You have wheezing or a cough that keeps happening.  Your symptoms do not get better with treatment.  You have a hoarse voice. Get help right away if:  You have pain in your arms, neck, jaw, teeth, or back.  You feel sweaty, dizzy, or light-headed.  You have chest pain or shortness of breath.  You throw up (vomit) and your throw-up looks like blood or coffee grounds.  You pass out (faint).  Your poop (stool) is bloody or black.  You cannot swallow, drink, or eat. Summary  If a person has gastroesophageal reflux disease (GERD), food and stomach acid move back up into the esophagus and cause symptoms or problems such as damage to the esophagus.  Treatment will depend on how bad your symptoms are.  Follow a diet as told by your doctor.  Take all medicines only as told by your doctor. This information is not intended to  replace advice given to you by your health care provider. Make sure you discuss any questions you have with your health care provider. Document Revised: 04/30/2018 Document Reviewed: 04/30/2018 Elsevier Patient Education  2020 [place] for Gastroesophageal Reflux Disease, Adult When you have gastroesophageal reflux disease (GERD), the foods you eat and your eating habits are very important. Choosing the right foods can help ease your discomfort. Think about working with a nutrition specialist (dietitian) to help you make good choices. What are tips for following this plan?  Meals  Choose healthy foods that are low in fat, such as fruits, vegetables, whole grains, low-fat dairy products, and lean meat, fish, and poultry.  Eat small meals often instead of 3 large meals a day. Eat your meals slowly, and in a place where you are relaxed. Avoid bending over or lying down until 2-3 hours after eating.  Avoid eating meals 2-3 hours before bed.  Avoid drinking a lot of liquid with meals.  Cook foods using methods other than frying. Bake, grill, or broil food instead.  Avoid or limit: ? Chocolate. ? Peppermint or spearmint. ? Alcohol. ? Pepper. ? Black and decaffeinated coffee. ? Black and decaffeinated tea. ? Bubbly (carbonated) soft drinks. ? Caffeinated energy drinks and soft drinks.  Limit high-fat foods such as: ? Fatty meat or fried foods. ? Whole milk, cream, butter, or ice cream. ? Nuts and nut butters. ? Pastries, donuts, and sweets made with butter or shortening.  Avoid foods that cause symptoms. These foods may be different for everyone. Common foods that cause symptoms include: ? Tomatoes. ? Oranges, lemons, and limes. ? Peppers. ? Spicy food. ? Onions and garlic. ? Vinegar. Lifestyle  Maintain a healthy weight. Ask your doctor what weight is healthy for you. If you need to lose weight, work with your doctor to do so safely.  Exercise for at  least 30 minutes for 5 or more days each week, or as told by your doctor.  Wear loose-fitting clothes.  Do not smoke. If you need help quitting, ask your doctor.  Sleep with the head of your bed higher than your feet. Use a wedge under the mattress or blocks under the bed frame to raise the head of the bed. Summary  When you have gastroesophageal reflux disease (GERD), food and lifestyle choices are very important in easing your symptoms.  Eat small meals often instead of 3 large meals a day. Eat your meals slowly, and in a place where you are relaxed.  Limit high-fat foods such as fatty meat or fried foods.  Avoid bending over or lying down until 2-3 hours after eating.  Avoid peppermint and spearmint, caffeine, alcohol, and chocolate. This information is not intended to replace advice given to you by your health care provider. Make sure you discuss any questions you have with your health care provider. Document Revised: 02/12/2019 Document Reviewed: 11/27/2016 Elsevier Patient Education  [employer].

## 2020-04-12 ENCOUNTER — Telehealth: Payer: Self-pay | Admitting: Internal Medicine

## 2020-04-12 ENCOUNTER — Encounter: Payer: Self-pay | Admitting: Family Medicine

## 2020-04-12 ENCOUNTER — Other Ambulatory Visit: Payer: Self-pay

## 2020-04-12 ENCOUNTER — Telehealth (INDEPENDENT_AMBULATORY_CARE_PROVIDER_SITE_OTHER): Payer: POS | Admitting: Family Medicine

## 2020-04-12 VITALS — Ht 69.5 in

## 2020-04-12 DIAGNOSIS — J019 Acute sinusitis, unspecified: Secondary | ICD-10-CM | POA: Diagnosis not present

## 2020-04-12 MED ORDER — PREDNISONE 10 MG PO TABS: ORAL_TABLET | ORAL | 0 refills | Status: DC

## 2020-04-12 NOTE — Progress Notes (Signed)
VIRTUAL VISIT Due to national recommendations of social distancing due to [place] 19, a virtual visit is felt to be most appropriate for this patient at this time.   I connected with the patient on 04/12/20 at  3:00 PM EDT by virtual telehealth platform and verified that I am speaking with the correct person using two identifiers.   I discussed the limitations, risks, security and privacy concerns of performing an evaluation and management service by  virtual telehealth platform and the availability of in person appointments. I also discussed with the patient that there may be a patient responsible charge related to this service. The patient expressed understanding and agreed to proceed.  Patient location: Home Provider Location: [place] [facility] Participants: [name] and [name]   Chief Complaint  Patient presents with  . Headache    Covid Vaccine last Thursday-J&J  . Chills  . Nasal Congestion  . Fatigue  . Sore Throat    History of Present Illness: 54 year old male patient of Dr. Verda Cumins with history of allergies presents for new onset respiratory symptoms.   He reports  Ion last 24 hours... he began with headache, mild scratchy sore throat, productive cough.  Chills yesterday off and on, sweats, no measured temperature.  Cough at night off and on.  He is feeling tired.  Having sinus pain and pressure.  no ear pain.  some diarrhea  no loss of taste and smell.   NO SOB, no wheeze.  He has been using aleve. He has been using Singulair and flonase. No sick contacts.  S/P COVID19 vaccine (J [employer]) last week  COVID 19 screen No recent travel or known exposure to [place] The importance of social distancing was discussed today.   Review of Systems  Constitutional: Positive for chills and malaise/fatigue. Negative for fever.  HENT: Positive for congestion and sore throat. Negative for ear pain.   Eyes: Negative for pain and redness.  Respiratory: Positive for  cough. Negative for shortness of breath.   Cardiovascular: Negative for chest pain, palpitations and leg swelling.  Gastrointestinal: Negative for abdominal pain, blood in stool, constipation, diarrhea, nausea and vomiting.  Genitourinary: Negative for dysuria.  Musculoskeletal: Negative for falls and myalgias.  Skin: Negative for rash.  Neurological: Negative for dizziness.  Psychiatric/Behavioral: Negative for depression. The patient is not nervous/anxious.       Past Medical History:  Diagnosis Date  . ADHD (attention deficit hyperactivity disorder)   . Allergy   . Anxiety   . Depression   . GERD (gastroesophageal reflux disease)   . Hyperlipidemia     reports that he has never smoked. His smokeless tobacco use includes snuff. He reports current alcohol use. He reports that he does not use drugs.   Current Outpatient Medications:  .  cetirizine (ZYRTEC) 10 MG chewable tablet, Chew 10 mg by mouth daily., Disp: , Rfl:  .  Cholecalciferol (VITAMIN D3 ADULT GUMMIES PO), Take 3 tablets by mouth daily. Gummies, Disp: , Rfl:  .  Cyanocobalamin (VITAMIN B 12 PO), Take 3 tablets by mouth daily. Gummies, Disp: , Rfl:  .  ezetimibe (ZETIA) 10 MG tablet, 1 tablet by mouth daily., Disp: 90 tablet, Rfl: 3 .  fexofenadine (ALLEGRA) 180 MG tablet, Take 180 mg by mouth daily., Disp: , Rfl:  .  lamoTRIgine (LAMICTAL) 100 MG tablet, Take 2.5 tablets (250 mg) in the morning and then 1/2 tablet (50 mg) at bedtime, Disp: 270 tablet, Rfl: 3 .  montelukast (  SINGULAIR) 10 MG tablet, Take 1 tablet (10 mg total) by mouth at bedtime., Disp: 30 tablet, Rfl: 3 .  Multiple Vitamin (MULTIVITAMIN) tablet, Take 1 tablet by mouth daily., Disp: , Rfl:  .  omeprazole (PRILOSEC) 40 MG capsule, Take 1 capsule (40 mg total) by mouth 2 (two) times daily., Disp: 90 capsule, Rfl: 3 .  rosuvastatin (CRESTOR) 40 MG tablet, Take 1 tablet (40 mg total) by mouth daily., Disp: 90 tablet, Rfl: 3 .  VIIBRYD 40 MG TABS, Take 1  tablet (40 mg total) by mouth every morning., Disp: 30 tablet, Rfl: 11   Observations/Objective: Height 5' 9.5" (1.765 m).  Physical Exam  Physical Exam Constitutional:      General: The patient is not in acute distress. Pulmonary:     Effort: Pulmonary effort is normal. No respiratory distress.  Neurological:     Mental Status: The patient is alert and oriented to person, place, and time.  Psychiatric:        Mood and Affect: Mood normal.        Behavior: Behavior normal.   Assessment and Plan Acute non-recurrent sinusitis Due to allergies vs viral illness.  Treat with antihistamine, nasal steroid spray. Add Nasal saline irrigation. Continue Singulair.  Rest, fluids.  If not iproving fill prednisone taper.  Consider COVID testing.     I discussed the assessment and treatment plan with the patient. The patient was provided an opportunity to ask questions and all were answered. The patient agreed with the plan and demonstrated an understanding of the instructions.   The patient was advised to call back or seek an in-person evaluation if the symptoms worsen or if the condition fails to improve as anticipated.     Fernando Lofts, MD

## 2020-04-12 NOTE — Progress Notes (Signed)
Work note written and sent to patient via his MyChart as instructed by Dr. Diona Browner.

## 2020-04-12 NOTE — Telephone Encounter (Signed)
Hi Dr. Henrene Pastor, this pt just rescheduled his egd that was scheduled with you for Friday 6/11 because he has not been feeling well lately. He has rescheduled to 8/2 at 10:00am. Thank you.

## 2020-04-12 NOTE — Telephone Encounter (Signed)
Noted  

## 2020-04-12 NOTE — Patient Instructions (Signed)
Rest, fluids.  Add back zyrtex or allegra or Xyzal to current allergy regimen.  Can use muscinex DM for cough and congestion.  Nasal saline rinse.  If not improving in next few days.. you can fill the prednisone taper. If not improving as expected.. have COVID testing.

## 2020-04-12 NOTE — Telephone Encounter (Signed)
Pt just r/s his egd to 8/2 at 10:00am. He is requesting a note for his work indicating this change. Pls send note through [place].

## 2020-04-12 NOTE — Assessment & Plan Note (Signed)
Due to allergies vs viral illness.  Treat with antihistamine, nasal steroid spray. Add Nasal saline irrigation. Continue Singulair.  Rest, fluids.  If not iproving fill prednisone taper.  Consider COVID testing.

## 2020-04-13 NOTE — Telephone Encounter (Signed)
New work note completed.  Sent patient a message asking to let me know if he needed anything else

## 2020-04-15 ENCOUNTER — Encounter: Payer: POS | Admitting: Internal Medicine

## 2020-05-02 ENCOUNTER — Other Ambulatory Visit: Payer: Self-pay | Admitting: Family Medicine

## 2020-05-02 DIAGNOSIS — J302 Other seasonal allergic rhinitis: Secondary | ICD-10-CM

## 2020-05-06 ENCOUNTER — Other Ambulatory Visit: Payer: Self-pay | Admitting: Family Medicine

## 2020-05-06 DIAGNOSIS — J302 Other seasonal allergic rhinitis: Secondary | ICD-10-CM

## 2020-05-16 ENCOUNTER — Encounter: Payer: POS | Admitting: Family Medicine

## 2020-06-06 ENCOUNTER — Encounter: Payer: POS | Admitting: Internal Medicine

## 2020-06-20 ENCOUNTER — Encounter: Payer: POS | Admitting: Family Medicine

## 2020-10-17 ENCOUNTER — Ambulatory Visit (INDEPENDENT_AMBULATORY_CARE_PROVIDER_SITE_OTHER): Payer: POS

## 2020-10-17 ENCOUNTER — Encounter: Payer: Self-pay | Admitting: Podiatry

## 2020-10-17 ENCOUNTER — Other Ambulatory Visit: Payer: Self-pay

## 2020-10-17 ENCOUNTER — Ambulatory Visit (INDEPENDENT_AMBULATORY_CARE_PROVIDER_SITE_OTHER): Payer: POS | Admitting: Podiatry

## 2020-10-17 DIAGNOSIS — M216X1 Other acquired deformities of right foot: Secondary | ICD-10-CM

## 2020-10-17 DIAGNOSIS — M778 Other enthesopathies, not elsewhere classified: Secondary | ICD-10-CM | POA: Diagnosis not present

## 2020-10-17 DIAGNOSIS — M722 Plantar fascial fibromatosis: Secondary | ICD-10-CM

## 2020-10-17 DIAGNOSIS — M21861 Other specified acquired deformities of right lower leg: Secondary | ICD-10-CM

## 2020-10-17 NOTE — Patient Instructions (Addendum)
Look for Voltaren gel at the pharmacy over the counter or online (also known as diclofenac 1% gel). Apply to the painful areas 3-4x daily with the supplied dosing card. Allow to dry for 10 minutes before going into socks/shoes   Take aleve or motrin as needed for pain and the inflammation in the tendons   EXERCISES:  RANGE OF MOTION (ROM) AND STRETCHING EXERCISES   Your symptoms may resolve with or without further involvement from your physician, physical therapist or athletic trainer. While completing these exercises, remember:   Restoring tissue flexibility helps normal motion to return to the joints. This allows healthier, less painful movement and activity.  An effective stretch should be held for at least 30 seconds.  A stretch should never be painful. You should only feel a gentle lengthening or release in the stretched tissue.  STRETCH  Gastroc, Standing   Place hands on wall.  Extend right / left leg, keeping the front knee somewhat bent.  Slightly point your toes inward on your back foot.  Keeping your right / left heel on the floor and your knee straight, shift your weight toward the wall, not allowing your back to arch.  You should feel a gentle stretch in the right / left calf. Hold this position for 10 seconds. Repeat 3 times. Complete this stretch 2 times per day.  STRETCH  Soleus, Standing   Place hands on wall.  Extend right / left leg, keeping the other knee somewhat bent.  Slightly point your toes inward on your back foot.  Keep your right / left heel on the floor, bend your back knee, and slightly shift your weight over the back leg so that you feel a gentle stretch deep in your back calf.  Hold this position for 10 seconds. Repeat 3 times. Complete this stretch 2 times per day.  STRETCH  Gastrocsoleus, Standing  Note: This exercise can place a lot of stress on your foot and ankle. Please complete this exercise only if specifically instructed by your  caregiver.   Place the ball of your right / left foot on a step, keeping your other foot firmly on the same step.  Hold on to the wall or a rail for balance.  Slowly lift your other foot, allowing your body weight to press your heel down over the edge of the step.  You should feel a stretch in your right / left calf.  Hold this position for 10 seconds.  Repeat this exercise with a slight bend in your knee. Repeat 3 times. Complete this stretch 2 times per day.

## 2020-10-17 NOTE — Progress Notes (Signed)
°  Subjective:  Patient ID: [name], male    DOB: October 07, 1966,  MRN: [id number]  Chief Complaint  Patient presents with   Plantar Fasciitis    Patient presents today for right heel/foot pain x 3 weeks.  He says its worse in the mornings and by the end of the day.  He says it throbs at night and feels like a bruise.    54 y.o. male presents with the above complaint. History confirmed with patient.   Objective:  Physical Exam: warm, good capillary refill, no trophic changes or ulcerative lesions, normal DP and PT pulses and normal sensory exam.  Right Foot: He has pain over the dorsal extensor tendons foot is worse with resistance.  Significant equinus is present.  No pain on plantar fascia or the heel pad itself.  No pain over the PT tendon or peroneals    Radiographs: X-ray of the right foot: no fracture, dislocation, swelling or degenerative changes noted Assessment:   1. Extensor tendinitis of foot   2. Gastrocnemius equinus of right lower extremity   3. Plantar fasciitis of right foot      Plan:  Patient was evaluated and treated and all questions answered.   It seems he likely has extensor tendinitis from significant equinus.  I discussed This with him and recommended stretching exercises, anti-inflammatories for pain relief as well as Voltaren gel.  The night splint would be helpful to help him stretch his triceps surae.  If no improvement next visit consider PT and/or MRI  Return in about 1 month (around 11/17/2020), or if symptoms worsen or fail to improve.

## 2020-11-04 ENCOUNTER — Other Ambulatory Visit: Payer: Self-pay | Admitting: Family Medicine

## 2020-11-04 DIAGNOSIS — J302 Other seasonal allergic rhinitis: Secondary | ICD-10-CM

## 2020-11-16 ENCOUNTER — Encounter: Payer: Self-pay | Admitting: Internal Medicine

## 2020-11-16 ENCOUNTER — Telehealth: Payer: Self-pay | Admitting: Internal Medicine

## 2020-11-16 NOTE — Telephone Encounter (Signed)
EGD scheduled on 12/26/20 at [facility].

## 2020-11-16 NOTE — Telephone Encounter (Signed)
Hi Dr. Henrene Pastor, this pt called to r/s his EGD that he cancelled last August. He saw you in the office last May. Would you like to see him in the office before rescheduling his procedure? Or can I r/s him directly? Thank you.

## 2020-11-16 NOTE — Telephone Encounter (Signed)
Okay to schedule EGD in [place] directly.

## 2020-11-28 ENCOUNTER — Ambulatory Visit: Payer: POS | Admitting: Podiatry

## 2020-12-12 ENCOUNTER — Ambulatory Visit (AMBULATORY_SURGERY_CENTER): Payer: POS | Admitting: *Deleted

## 2020-12-12 ENCOUNTER — Other Ambulatory Visit: Payer: Self-pay

## 2020-12-12 VITALS — Ht 69.5 in | Wt 205.0 lb

## 2020-12-12 DIAGNOSIS — R1310 Dysphagia, unspecified: Secondary | ICD-10-CM

## 2020-12-12 DIAGNOSIS — K219 Gastro-esophageal reflux disease without esophagitis: Secondary | ICD-10-CM

## 2020-12-12 NOTE — Progress Notes (Signed)
Patient's pre-visit was done today over the phone with the patient due to COVID-19 pandemic. Name,DOB and address verified. Insurance verified. Patient denies any allergies to Eggs and Soy. Patient denies any problems with anesthesia/sedation. Patient denies taking diet pills or blood thinners. Packet of Prep instructions mailed to patient including a copy of a consent form and pre-procedure patient acknowledgement form (with envelope to mail back to us)-pt is aware. Patient understands to call us back with any questions or concerns.  Patient is aware of our care-partner policy and UMPNT-61 safety protocol. EMMI education assigned to the patient for the procedure, sent to [facility].  COVID-19 vaccines completed on 04/2020 J[employer]J, per patient.

## 2020-12-21 ENCOUNTER — Encounter: Payer: Self-pay | Admitting: Internal Medicine

## 2020-12-24 ENCOUNTER — Other Ambulatory Visit: Payer: Self-pay | Admitting: Family Medicine

## 2020-12-24 DIAGNOSIS — K219 Gastro-esophageal reflux disease without esophagitis: Secondary | ICD-10-CM

## 2020-12-26 ENCOUNTER — Ambulatory Visit (AMBULATORY_SURGERY_CENTER): Payer: POS | Admitting: Internal Medicine

## 2020-12-26 ENCOUNTER — Other Ambulatory Visit: Payer: Self-pay

## 2020-12-26 ENCOUNTER — Encounter: Payer: Self-pay | Admitting: Internal Medicine

## 2020-12-26 VITALS — BP 112/75 | HR 62 | Temp 97.8°F | Resp 17 | Ht 69.5 in | Wt 205.0 lb

## 2020-12-26 DIAGNOSIS — K2970 Gastritis, unspecified, without bleeding: Secondary | ICD-10-CM

## 2020-12-26 DIAGNOSIS — K219 Gastro-esophageal reflux disease without esophagitis: Secondary | ICD-10-CM

## 2020-12-26 DIAGNOSIS — R1310 Dysphagia, unspecified: Secondary | ICD-10-CM

## 2020-12-26 MED ORDER — SODIUM CHLORIDE 0.9 % IV SOLN: 500.0000 mL | Freq: Once | INTRAVENOUS | Status: DC

## 2020-12-26 NOTE — Op Note (Signed)
[place] Patient Name: [name] Procedure Date: 12/26/2020 10:03 AM MRN: [id number] Endoscopist: Docia Chuck. Henrene Pastor , MD Age: 55 Referring MD:  Date of Birth: 05-23-1966 Gender: Male Account #: [id number] Procedure:                Upper GI endoscopy with biopsies. Maloney dilation                            of the esophagus. 30 [place] Indications:              Dysphagia (intermittent solid food), Esophageal                            reflux, Abdominal bloating Medicines:                Monitored Anesthesia Care Procedure:                Pre-Anesthesia Assessment:                           - Prior to the procedure, a History and Physical                            was performed, and patient medications and                            allergies were reviewed. The patient's tolerance of                            previous anesthesia was also reviewed. The risks                            and benefits of the procedure and the sedation                            options and risks were discussed with the patient.                            All questions were answered, and informed consent                            was obtained. Prior Anticoagulants: The patient has                            taken no previous anticoagulant or antiplatelet                            agents. ASA Grade Assessment: II - A patient with                            mild systemic disease. After reviewing the risks                            and benefits, the patient was deemed in  satisfactory condition to undergo the procedure.                           After obtaining informed consent, the endoscope was                            passed under direct vision. Throughout the                            procedure, the patient's blood pressure, pulse, and                            oxygen saturations were monitored continuously. The                            Endoscope was introduced  through the mouth, and                            advanced to the second part of duodenum. The upper                            GI endoscopy was accomplished without difficulty.                            The patient tolerated the procedure well. Scope In: Scope Out: Findings:                 The esophagus was normal. No obvious stricture.                            After completing the endoscopic survey, the scope                            was withdrawn. Dilation was performed with a                            Maloney dilator with no resistance at 70 Fr.                           The stomach was normal, save small hiatal hernia.                            Biopsies were taken with a cold forceps for                            Helicobacter pylori testing using CLOtest.                           The examined duodenum was normal.                           The cardia and gastric fundus were normal on  retroflexion. Retroflexed image did not capture. Complications:            No immediate complications. Estimated Blood Loss:     Estimated blood loss: none. Impression:               - Normal esophagus. Dilated.                           - Normal stomach. Biopsied.                           - Normal examined duodenum. Recommendation:           1. Patient has a contact number available for                            emergencies. The signs and symptoms of potential                            delayed complications were discussed with the                            patient. Return to normal activities tomorrow.                            Written discharge instructions were provided to the                            patient.                           2. Post dilation diet.                           3. Continue present medications.                           4. Follow-up biopsies                           5. Recommend probiotic Align for chronic bloating.                             Take 1 twice daily for 4 weeks. Can be obtained                            over-the-counter at the pharmacy                           6. Routine GI follow-up 3 months Docia Chuck. Henrene Pastor, MD 12/26/2020 10:20:06 AM This report has been signed electronically.

## 2020-12-26 NOTE — Patient Instructions (Signed)
Follow post dilation diet.   YOU HAD AN ENDOSCOPIC PROCEDURE TODAY AT [facility]:   Refer to the procedure report that was given to you for any specific questions about what was found during the examination.  If the procedure report does not answer your questions, please call your gastroenterologist to clarify.  If you requested that your care partner not be given the details of your procedure findings, then the procedure report has been included in a sealed envelope for you to review at your convenience later.  YOU SHOULD EXPECT: Some feelings of bloating in the abdomen. Passage of more gas than usual.  Walking can help get rid of the air that was put into your GI tract during the procedure and reduce the bloating. If you had a lower endoscopy (such as a colonoscopy or flexible sigmoidoscopy) you may notice spotting of blood in your stool or on the toilet paper. If you underwent a bowel prep for your procedure, you may not have a normal bowel movement for a few days.  Please Note:  You might notice some irritation and congestion in your nose or some drainage.  This is from the oxygen used during your procedure.  There is no need for concern and it should clear up in a day or so.  SYMPTOMS TO REPORT IMMEDIATELY:   Following upper endoscopy (EGD)  Vomiting of blood or coffee ground material  New chest pain or pain under the shoulder blades  Painful or persistently difficult swallowing  New shortness of breath  Fever of 100F or higher  Black, tarry-looking stools  For urgent or emergent issues, a gastroenterologist can be reached at any hour by calling [phone]. Do not use MyChart messaging for urgent concerns.    ACTIVITY:  You should plan to take it easy for the rest of today and you should NOT DRIVE or use heavy machinery until tomorrow (because of the sedation medicines used during the test).    FOLLOW UP: Our staff will call the number listed on your records  48-72 hours following your procedure to check on you and address any questions or concerns that you may have regarding the information given to you following your procedure. If we do not reach you, we will leave a message.  We will attempt to reach you two times.  During this call, we will ask if you have developed any symptoms of COVID 19. If you develop any symptoms (ie: fever, flu-like symptoms, shortness of breath, cough etc.) before then, please call [phone].  If you test positive for Covid 19 in the 2 weeks post procedure, please call and report this information to [place].    If any biopsies were taken you will be contacted by phone or by letter within the next 1-3 weeks.  Please call us at [phone] if you have not heard about the biopsies in 3 weeks.    SIGNATURES/CONFIDENTIALITY: You and/or your care partner have signed paperwork which will be entered into your electronic medical record.  These signatures attest to the fact that that the information above on your After Visit Summary has been reviewed and is understood.  Full responsibility of the confidentiality of this discharge information lies with you and/or your care-partner.

## 2020-12-26 NOTE — Progress Notes (Signed)
Pt's states no medical or surgical changes since previsit or office visit.  VS SB

## 2020-12-26 NOTE — Progress Notes (Signed)
Called to room to assist during endoscopic procedure.  Patient ID and intended procedure confirmed with present staff. Received instructions for my participation in the procedure from the performing physician.  

## 2020-12-26 NOTE — Progress Notes (Signed)
To pacu, VSS. Report to [email or web address] 

## 2020-12-27 LAB — HELICOBACTER PYLORI SCREEN-BIOPSY: UREASE: NEGATIVE

## 2020-12-28 ENCOUNTER — Telehealth: Payer: Self-pay | Admitting: *Deleted

## 2020-12-28 NOTE — Telephone Encounter (Signed)
  Follow up Call-  Call back number 12/26/2020  Post procedure Call Back phone  # [phone]  Permission to leave phone message Yes  Some recent data might be hidden     Patient questions:  Message left to call us if necessary.

## 2020-12-28 NOTE — Telephone Encounter (Signed)
No answer for post procedure call back. Left message for patient to call with questions or concerns. 

## 2021-01-03 ENCOUNTER — Encounter: Payer: POS | Admitting: Family Medicine

## 2021-01-16 ENCOUNTER — Encounter: Payer: POS | Admitting: Family Medicine

## 2021-01-17 ENCOUNTER — Other Ambulatory Visit: Payer: Self-pay | Admitting: Family Medicine

## 2021-01-18 NOTE — Telephone Encounter (Signed)
Pt needs follow-up appt for further refills.

## 2021-01-19 NOTE — Telephone Encounter (Signed)
Patient scheduled 02/07/21.

## 2021-02-06 ENCOUNTER — Telehealth: Payer: Self-pay

## 2021-02-06 NOTE — Telephone Encounter (Signed)
[name] - Client Nonclinical Telephone Record AccessNurse Client [name] - Client Client Site [place] Physician Waunita Schooner- MD Contact Type Call Who Is Calling Patient / Member / Family / Caregiver Caller Name [name] Caller Phone Number [phone] Patient Name [name] Patient DOB 09/06/1966 Call Type Message Only Information Provided Reason for Call Request to Reschedule Office Appointment Initial Comment Caller states he is needing to reschedule his physical for tomorrow. Additional Comment Provided office hours Disp. Time Disposition Final User 02/06/2021 8:10:52 AM General Information Provided Yes Vinnie Level Call Closed By: Vinnie Level Transaction Date/Time: 02/06/2021 8:07:34 AM (ET)

## 2021-02-06 NOTE — Telephone Encounter (Signed)
Viewed schedule and was scheduled 4/18.

## 2021-02-07 ENCOUNTER — Encounter: Payer: POS | Admitting: Family Medicine

## 2021-02-20 ENCOUNTER — Other Ambulatory Visit: Payer: Self-pay

## 2021-02-20 ENCOUNTER — Ambulatory Visit (INDEPENDENT_AMBULATORY_CARE_PROVIDER_SITE_OTHER): Payer: POS | Admitting: Family Medicine

## 2021-02-20 VITALS — BP 110/76 | HR 103 | Temp 97.4°F | Ht 69.5 in | Wt 218.5 lb

## 2021-02-20 DIAGNOSIS — K219 Gastro-esophageal reflux disease without esophagitis: Secondary | ICD-10-CM

## 2021-02-20 DIAGNOSIS — Z23 Encounter for immunization: Secondary | ICD-10-CM

## 2021-02-20 DIAGNOSIS — R4586 Emotional lability: Secondary | ICD-10-CM

## 2021-02-20 DIAGNOSIS — F411 Generalized anxiety disorder: Secondary | ICD-10-CM | POA: Diagnosis not present

## 2021-02-20 DIAGNOSIS — I10 Essential (primary) hypertension: Secondary | ICD-10-CM | POA: Diagnosis not present

## 2021-02-20 DIAGNOSIS — E7800 Pure hypercholesterolemia, unspecified: Secondary | ICD-10-CM

## 2021-02-20 DIAGNOSIS — Z1159 Encounter for screening for other viral diseases: Secondary | ICD-10-CM

## 2021-02-20 DIAGNOSIS — F339 Major depressive disorder, recurrent, unspecified: Secondary | ICD-10-CM

## 2021-02-20 MED ORDER — VIIBRYD 40 MG PO TABS: ORAL_TABLET | ORAL | 3 refills | Status: AC

## 2021-02-20 MED ORDER — LAMOTRIGINE 100 MG PO TABS
ORAL_TABLET | ORAL | 3 refills | Status: AC
Start: 2021-02-20 — End: ?

## 2021-02-20 MED ORDER — ROSUVASTATIN CALCIUM 40 MG PO TABS: 40.0000 mg | ORAL_TABLET | Freq: Every day | ORAL | 3 refills | Status: AC

## 2021-02-20 MED ORDER — EZETIMIBE 10 MG PO TABS: ORAL_TABLET | ORAL | 3 refills | Status: DC

## 2021-02-20 MED ORDER — OMEPRAZOLE 40 MG PO CPDR: 1.0000 | DELAYED_RELEASE_CAPSULE | Freq: Two times a day (BID) | ORAL | 1 refills | Status: AC

## 2021-02-20 NOTE — Patient Instructions (Signed)
Try to avoid additional weight gain  Continue regular dental and vision screening

## 2021-02-20 NOTE — Progress Notes (Signed)
Annual Exam   Chief Complaint:  Chief Complaint  Patient presents with  . Annual Exam    No concerns     History of Present Illness:  [name] is a 55 y.o. presents today for annual examination.     Nutrition/Lifestyle Diet: does good during the day, worse at night Exercise: walking 20,000 steps per day He is single partner, contraception - none.  Any issues with getting or keeping erection? No  Social History   Tobacco Use  Smoking Status Never Smoker  Smokeless Tobacco Current User  . Types: Snuff   Social History   Substance and Sexual Activity  Alcohol Use Yes   Comment: 1-2 beers on the weekend   Social History   Substance and Sexual Activity  Drug Use No     Safety The patient wears seatbelts: yes.     The patient feels safe at home and in their relationships: yes.  General Health Dentist in the last year: Yes Eye doctor: yes  Weight Wt Readings from Last 3 Encounters:  02/20/21 218 lb 8 oz (99.1 kg)  12/26/20 205 lb (93 kg)  12/12/20 205 lb (93 kg)   Patient has high BMI  BMI Readings from Last 1 Encounters:  02/20/21 31.80 kg/m     Chronic disease screening Blood pressure monitoring:  BP Readings from Last 3 Encounters:  02/20/21 110/76  12/26/20 112/75  03/21/20 136/80    Lipid Monitoring: Indication for screening: age >35, obesity, diabetes, family hx, CV risk factors.  Lipid screening: Yes  Lab Results  Component Value Date   CHOL 204 (H) 12/10/2019   HDL 38 (L) 12/10/2019   LDLCALC 143 (H) 12/10/2019   LDLDIRECT 105 (H) 04/28/2018   TRIG 127 12/10/2019   CHOLHDL 5.4 (H) 12/10/2019     Diabetes Screening: age >58, overweight, family hx, PCOS, hx of gestational diabetes, at risk ethnicity, elevated blood pressure >135/80.  Diabetes Screening screening: Yes  Lab Results  Component Value Date   HGBA1C 5.5 12/10/2019     Prostate Cancer Screening: Yes Age 77-69 yo Shared Decision Making Higher Risk: Older age,  African American, Family Hx of Prostate Cancer - No Benefits: screening may prevent 1.3 deaths from prostate cancer over 13 years per 1000 men screened and prevent 3 metastatic cases per 1000 men screened. Not enough evidence to support more benefit for AA or Mobile Harms: False Positive and psychological harms. 15% of me with false positive over a 2 to 4 year period > resulting in biopsy and complications such as pain, hematospermia, infections. Overdiagnosis - increases with age - found that 20-50% of prostate cancer through screening may have never caused any issues. Harms of treatment include - erectile dysfunction, urinary incontinence, and bothersome bowel symptoms.   After discussion he does not want to get a PSA checked today.   Inadequate evidence for screening <55 No mortality benefit for screening >70   Lab Results  Component Value Date   PSA1 1.5 12/10/2019   PSA1 2.0 05/11/2019   PSA1 1.6 04/28/2018   PSA 1.5 12/10/2019   PSA 1.9 11/24/2014       Colon Cancer Screening:  Age 60-75 yo - benefits outweigh the risk. Adults 5-85 yo who have never been screened benefit.  Benefits: 134000 people in 2016 will be diagnosed and 49,000 will die - early detection helps Harms: Complications 2/2 to colonoscopy High Risk (Colonoscopy): genetic disorder (Lynch syndrome or familial adenomatous polyposis), personal hx of IBD, previous adenomatous  polyp, or previous colorectal cancer, FamHx start 10 years before the age at diagnosis, increased in males and black race  Options:  FIT - looks for hemoglobin (blood in the stool) - specific and fairly sensitive - must be done annually Cologuard - looks for DNA and blood - more sensitive - therefore can have more false positives, every 3 years Colonoscopy - every 10 years if normal - sedation, bowl prep, must have someone drive you  Shared decision making and the patient had decided to do colonoscopy due in 2024.   Social History   Tobacco  Use  Smoking Status Never Smoker  Smokeless Tobacco Current User  . Types: Snuff    Lung Cancer Screening (Ages 03-47): not applicable       Past Medical History:  Diagnosis Date  . ADHD (attention deficit hyperactivity disorder)   . Allergy   . Anxiety   . Depression   . GERD (gastroesophageal reflux disease)   . Hyperlipidemia     Past Surgical History:  Procedure Laterality Date  . CARPAL TUNNEL RELEASE  12/2018  . [place]   left  . COLONOSCOPY  2019  . NASAL SINUS SURGERY    . SPINE SURGERY  1999   Lumbar   . VARICOCELE EXCISION  12/2005    Prior to Admission medications   Medication Sig Start Date End Date Taking? Authorizing Provider  APPLE CIDER VINEGAR PO Take by mouth.   Yes [provider]  Ascorbic Acid (VITAMIN C PO) Take by mouth.   Yes [provider]  Cholecalciferol (VITAMIN D3 ADULT GUMMIES PO) Take 3 tablets by mouth daily. Gummies   Yes [provider]  Cyanocobalamin (VITAMIN B 12 PO) Take 3 tablets by mouth daily. Gummies   Yes [provider]  ezetimibe (ZETIA) 10 MG tablet 1 tablet by mouth daily. 12/10/19  Yes Lucille Passy, MD  lamoTRIgine (LAMICTAL) 100 MG tablet Take 2.5 tablets (250 mg) in the morning and then 1/2 tablet (50 mg) at bedtime Patient taking differently: Take 200 mg by mouth daily. 02/09/20  Yes Lesleigh Noe, MD  loratadine (CLARITIN) 10 MG tablet Take 10 mg by mouth daily.   Yes [provider]  Multiple Vitamin (MULTIVITAMIN) tablet Take 1 tablet by mouth daily.   Yes [provider]  omeprazole (PRILOSEC) 40 MG capsule TAKE 1 CAPSULE BY MOUTH TWICE A DAY 12/26/20  Yes Lesleigh Noe, MD  Probiotic Product (PROBIOTIC-10 PO) Take by mouth.   Yes [provider]  rosuvastatin (CRESTOR) 40 MG tablet Take 1 tablet (40 mg total) by mouth daily. 02/09/20  Yes Lesleigh Noe, MD  Triamcinolone Acetonide (NASACORT ALLERGY 24HR NA) Place into the nose.    Yes [provider]  VIIBRYD 40 MG TABS TAKE 1 TABLET BY MOUTH EVERY DAY IN THE MORNING 01/18/21  Yes Lesleigh Noe, MD  VITAMIN D PO Take by mouth.   Yes [provider]  zinc gluconate 50 MG tablet Take 50 mg by mouth daily.   Yes [provider]    Allergies  Allergen Reactions  . Bupropion Hcl     REACTION: anxiety  . Fluoxetine Hcl     REACTION: more anxious and tired     Social History   Socioeconomic History  . Marital status: Married    Spouse name: [name]  . Number of children: 0  . Years of education: Bachelors Degree  . Highest education level: Not on file  Occupational History  . Occupation: Scientific laboratory technician: [employer]  Tobacco Use  . Smoking status: Never Smoker  . Smokeless tobacco: Current User    Types: Snuff  Vaping Use  . Vaping Use: Never used  Substance and Sexual Activity  . Alcohol use: Yes    Comment: 1-2 beers on the weekend  . Drug use: No  . Sexual activity: Yes    Birth control/protection: Post-menopausal  Other Topics Concern  . Not on file  Social History Narrative   02/09/20   From[place] around here   Living: with wife [name] (2000)   Work: Actor - night shift      Family: good relationship with mom, lives nearby      Enjoys: golf, watch movies, walking      Exercise: job is labor intensive, walking one time a week, occasional work-out videos   Diet: bad on the weekends, does good during the week      Safety   Seat belts: Yes    Guns: No   Safe in relationships: Yes    Social Determinants of Radio broadcast assistant Strain: Not on file  Food Insecurity: Not on file  Transportation Needs: Not on file  Physical Activity: Not on file  Stress: Not on file  Social Connections: Not on file  Intimate Partner Violence: Not on file    Family History  Problem Relation Age of Onset  . Hyperlipidemia Mother   . Heart attack Mother 74  . Hypertension Brother   . Colon cancer  Neg Hx   . Colon polyps Neg Hx   . Esophageal cancer Neg Hx   . Stomach cancer Neg Hx   . Rectal cancer Neg Hx   . Liver cancer Neg Hx   . Pancreatic cancer Neg Hx   . Prostate cancer Neg Hx     Review of Systems  Constitutional: Negative for chills and fever.  HENT: Negative for congestion and sore throat.   Eyes: Negative for blurred vision and double vision.  Respiratory: Negative for shortness of breath.   Cardiovascular: Negative for chest pain.  Gastrointestinal: Negative for heartburn, nausea and vomiting.  Genitourinary: Negative.   Musculoskeletal: Negative.  Negative for myalgias.  Skin: Negative for rash.  Neurological: Negative for dizziness and headaches.  Endo/Heme/Allergies: Does not bruise/bleed easily.  Psychiatric/Behavioral: Negative for depression. The patient is not nervous/anxious.      Physical Exam BP 110/76   Pulse (!) 103   Temp (!) 97.4 F (36.3 C) (Temporal)   Ht 5' 9.5" (1.765 m)   Wt 218 lb 8 oz (99.1 kg)   SpO2 96%   BMI 31.80 kg/m    BP Readings from Last 3 Encounters:  02/20/21 110/76  12/26/20 112/75  03/21/20 136/80      Physical Exam Constitutional:      General: He is not in acute distress.    Appearance: He is well-developed. He is not diaphoretic.  HENT:     Head: Normocephalic and atraumatic.     Right Ear: Tympanic membrane and ear canal normal.     Left Ear: Tympanic membrane and ear canal normal.     Nose: Nose normal.     Mouth/Throat:     Pharynx: Uvula midline.  Eyes:     General: No scleral icterus.    Conjunctiva/sclera: Conjunctivae normal.     Pupils: Pupils are equal, round, and reactive to light.  Cardiovascular:  Rate and Rhythm: Normal rate and regular rhythm.     Heart sounds: Normal heart sounds. No murmur heard.   Pulmonary:     Effort: Pulmonary effort is normal. No respiratory distress.     Breath sounds: Normal breath sounds. No wheezing.  Abdominal:     General: Bowel sounds are normal.  There is no distension.     Palpations: Abdomen is soft. There is no mass.     Tenderness: There is no abdominal tenderness. There is no guarding.  Musculoskeletal:        General: Normal range of motion.     Cervical back: Normal range of motion and neck supple.  Lymphadenopathy:     Cervical: No cervical adenopathy.  Skin:    General: Skin is warm and dry.     Capillary Refill: Capillary refill takes less than 2 seconds.  Neurological:     Mental Status: He is alert and oriented to person, place, and time.        Results:  PHQ-9:  [facility] Office Visit from 02/09/2020 in [facility]  PHQ-9 Total Score 2        Assessment: 55 y.o. here for routine annual physical examination.  Plan: Problem List Items Addressed This Visit      Cardiovascular and Mediastinum   HTN (hypertension) - Primary   Relevant Medications   rosuvastatin (CRESTOR) 40 MG tablet   ezetimibe (ZETIA) 10 MG tablet   Other Relevant Orders   Comprehensive metabolic panel   Lipid panel     Other   HLD (hyperlipidemia)   Relevant Medications   rosuvastatin (CRESTOR) 40 MG tablet   ezetimibe (ZETIA) 10 MG tablet   Other Relevant Orders   Comprehensive metabolic panel   Lipid panel   Anxiety state   Relevant Medications   Vilazodone HCl (VIIBRYD) 40 MG TABS   lamoTRIgine (LAMICTAL) 100 MG tablet   Depression, recurrent (HCC)   Relevant Medications   Vilazodone HCl (VIIBRYD) 40 MG TABS   lamoTRIgine (LAMICTAL) 100 MG tablet   Mood swings   Relevant Medications   lamoTRIgine (LAMICTAL) 100 MG tablet    Other Visit Diagnoses    Gastro-esophageal reflux disease without esophagitis       Relevant Medications   omeprazole (PRILOSEC) 40 MG capsule   Other Relevant Orders   Comprehensive metabolic panel   Lipid panel   Need for hepatitis C screening test       Relevant Orders   Hepatitis C antibody   Need for Tdap vaccination       Relevant Orders   Tdap vaccine  greater than or equal to 7yo IM      Screening: -- Blood pressure screen normal -- cholesterol screening: will obtain -- Weight screening: overweight: continue to monitor -- Diabetes Screening: will obtain -- Nutrition: normal - Encouraged healthy diet  The 10-year ASCVD risk score Mikey Bussing DC Jr., et al., 2013) is: 4.8%   Values used to calculate the score:     Age: 39 years     Sex: Male     Is Non-Hispanic African American: No     Diabetic: No     Tobacco smoker: No     Systolic Blood Pressure: 295 mmHg     Is BP treated: No     HDL Cholesterol: 38 mg/dL     Total Cholesterol: 204 mg/dL  -- ASA 81 mg discussed if CVD risk >10% age 32-59 and willing to take for  10 years -- Statin therapy for Age 69-75 with CVD risk >7.5%  Psych -- Depression screening (PHQ-9): negative  Safety -- tobacco screening: encouraged quitting snuff -- alcohol screening:  low-risk usage. -- no evidence of domestic violence or intimate partner violence.  Cancer Screening -- Prostate (age 69-69) not at age -- Colon (age 55-75) up to date -- Lung not indicated   Immunizations Immunization History  Administered Date(s) Administered  . Influenza Inj Mdck Quad Pf 08/12/2018  . Influenza Split 07/22/2012  . Influenza Whole 08/07/2007, 09/16/2008, 08/18/2010  . Influenza,inj,Quad PF,6+ Mos 07/20/2014  . Influenza-Unspecified 07/24/2017  . Janssen (J&J) SARS-COV-2 Vaccination 04/07/2020  . Td 02/17/2010  . Zoster Recombinat (Shingrix) 12/12/2020    -- flu vaccine not in season  -- TDAP q10 years up to date -- Shingles (age >48) up to date -- Covid-19 Vaccine declined booster  Encouraged regular vision and dental screening. Encouraged healthy exercise and diet.   Lesleigh Noe

## 2021-02-21 LAB — COMPREHENSIVE METABOLIC PANEL
ALT: 61 IU/L — ABNORMAL HIGH (ref 0–44)
AST: 32 IU/L (ref 0–40)
Albumin/Globulin Ratio: 2.7 — ABNORMAL HIGH (ref 1.2–2.2)
Albumin: 4.9 g/dL (ref 3.8–4.9)
Alkaline Phosphatase: 77 IU/L (ref 44–121)
BUN/Creatinine Ratio: 11 (ref 9–20)
BUN: 12 mg/dL (ref 6–24)
Bilirubin Total: 0.5 mg/dL (ref 0.0–1.2)
CO2: 23 mmol/L (ref 20–29)
Calcium: 9.6 mg/dL (ref 8.7–10.2)
Chloride: 101 mmol/L (ref 96–106)
Creatinine, Ser: 1.11 mg/dL (ref 0.76–1.27)
Globulin, Total: 1.8 g/dL (ref 1.5–4.5)
Glucose: 115 mg/dL — ABNORMAL HIGH (ref 65–99)
Potassium: 4.2 mmol/L (ref 3.5–5.2)
Sodium: 140 mmol/L (ref 134–144)
Total Protein: 6.7 g/dL (ref 6.0–8.5)
eGFR: 79 mL/min/{1.73_m2} (ref >59)

## 2021-02-21 LAB — HEPATITIS C ANTIBODY: Hep C Virus Ab: <0.1 s/co ratio (ref 0.0–0.9)

## 2021-02-21 LAB — LIPID PANEL
Chol/HDL Ratio: 3.7 ratio (ref 0.0–5.0)
Cholesterol, Total: 183 mg/dL (ref 100–199)
HDL: 49 mg/dL (ref >39)
LDL Chol Calc (NIH): 116 mg/dL — ABNORMAL HIGH (ref 0–99)
Triglycerides: 99 mg/dL (ref 0–149)
VLDL Cholesterol Cal: 18 mg/dL (ref 5–40)

## 2021-07-18 ENCOUNTER — Telehealth: Payer: Self-pay | Admitting: *Deleted

## 2021-07-18 NOTE — Telephone Encounter (Signed)
PLEASE NOTE: All timestamps contained within this report are represented as [place] Standard Time. CONFIDENTIALTY NOTICE: This fax transmission is intended only for the addressee. It contains information that is legally privileged, confidential or otherwise protected from use or disclosure. If you are not the intended recipient, you are strictly prohibited from reviewing, disclosing, copying using or disseminating any of this information or taking any action in reliance on or regarding this information. If you have received this fax in error, please notify us immediately by telephone so that we can arrange for its return to [place]. Phone: [phone], Toll-Free: [phone], Fax: [phone] Page: 1 of 2 Call Id: [id number] [facility] - Client TELEPHONE ADVICE RECORD [facility] Patient Name: [name] Gender: Male DOB: 1966-07-28 Age: 55 Y 15 M 5 D Return Phone Number: [phone] (Primary) Address: City/ State/ Zip: [place] [address] Client [facility] - Client Client Site [facility] - Day Physician Waunita Schooner- MD Contact Type Call Who Is Calling Patient / Member / Family / Caregiver Call Type Triage / Clinical Relationship To Patient Self Return Phone Number [phone] (Primary) Chief Complaint Blood Pressure High Reason for Call Symptomatic / Request for Health Information Initial Comment Transferred from office, no appt open. PT is having high BP and having surgery soon and needing toi get it under control, 177/109 Translation No Nurse Assessment Nurse: Nyoka Cowden, RN, Jordan Hawks Date/Time (Eastern Time): 07/18/2021 10:06:26 AM Confirm and document reason for call. If symptomatic, describe symptoms. ---Caller states he is having nasal surgery in 3 weeks and was seen at a pre op appointment and was told his bp was high and needed to see his pcp. Does the patient have any new or  worsening symptoms? ---Yes Will a triage be completed? ---Yes Related visit to physician within the last 2 weeks? ---Yes Does the PT have any chronic conditions? (i.e. diabetes, asthma, this includes High risk factors for pregnancy, etc.) ---No Is this a behavioral health or substance abuse call? ---No Guidelines Guideline Title Affirmed Question Affirmed Notes Nurse Date/Time (Eastern Time) Blood Pressure - High Systolic BP >= 0000000 OR Diastolic >= 123XX123 Green, RN, [place] 07/18/2021 10:08:17 AM Disp. Time Eilene Ghazi Time) Disposition Final User 07/18/2021 10:11:44 AM SEE PCP WITHIN 3 DAYS Yes Nyoka Cowden, RN, Beatrix Shipper Disagree/Comply Comply PLEASE NOTE: All timestamps contained within this report are represented as [place] Standard Time. CONFIDENTIALTY NOTICE: This fax transmission is intended only for the addressee. It contains information that is legally privileged, confidential or otherwise protected from use or disclosure. If you are not the intended recipient, you are strictly prohibited from reviewing, disclosing, copying using or disseminating any of this information or taking any action in reliance on or regarding this information. If you have received this fax in error, please notify us immediately by telephone so that we can arrange for its return to [place]. Phone: [phone], Toll-Free: [phone], Fax: [phone] Page: 2 of 2 Call Id: [id number] [place] Understands Yes PreDisposition Call Doctor Care Advice Given Per Guideline SEE PCP WITHIN 3 DAYS: * You need to be seen within 2 or 3 days. * PCP VISIT: Call your doctor (or NP/PA) during regular office hours and make an appointment. A clinic or urgent care center are good places to go for care if your doctor's office is closed or you can't get an appointment. NOTE: If office will be open tomorrow, tell caller to call then, not in 3 days. CALL BACK IF: * Difficulty  walking, difficulty talking, or severe headache occurs *  Weakness or numbness of the face, arm or leg on one side of the body occurs * Chest pain or difficulty breathing occurs * Your blood pressure is over 180/110 * You become worse CARE ADVICE given per High Blood Pressure (Adult) guideline. Comments User: Shanon Rosser, RN Date/Time Eilene Ghazi Time): 07/18/2021 10:12:33 AM Patient is currently not having any symptoms from his high blood pressure at this time. Referrals REFERRED TO PCP OFFICE

## 2021-07-18 NOTE — Telephone Encounter (Signed)
Left message on voicemail for patient to call the office back. Can add patient to Dr. Verda Cumins scheduled tomorrow 07/19/21 at 11:40 am.

## 2021-07-18 NOTE — Telephone Encounter (Signed)
Pt called back and is aware he has appt 07/19/21 at 11:40 with Dr Einar Pheasant at [facility] office.  Pt said he saw surgeon earlier today and BP 177/109.  Pt said he has a lot of anxiety.  Pt said usually his BP is normal and is not on any BP meds.  Pt took BP at 12:15 today after getting home BP 166/106 P 80's. Pt does not have CP,SOB or dizziness. Pt said has H/A with pressure and pt thinks due to sinus issues.  Pt does not have covid symptoms and has had no exposure to his knowledge. UC & ED precautions given and pt voiced understanding. Sending note to Dr Einar Pheasant and [facility] CMA. Pt said he is going to relax this afternoon and evening and will rest, stay cool and drink plenty of water.

## 2021-07-18 NOTE — Telephone Encounter (Signed)
ret

## 2021-07-19 ENCOUNTER — Other Ambulatory Visit: Payer: Self-pay

## 2021-07-19 ENCOUNTER — Ambulatory Visit: Payer: POS | Admitting: Family Medicine

## 2021-07-19 VITALS — BP 162/110 | HR 97 | Temp 97.1°F | Ht 69.5 in | Wt 218.2 lb

## 2021-07-19 DIAGNOSIS — R9431 Abnormal electrocardiogram [ECG] [EKG]: Secondary | ICD-10-CM

## 2021-07-19 DIAGNOSIS — R5383 Other fatigue: Secondary | ICD-10-CM | POA: Diagnosis not present

## 2021-07-19 DIAGNOSIS — I10 Essential (primary) hypertension: Secondary | ICD-10-CM | POA: Diagnosis not present

## 2021-07-19 DIAGNOSIS — Z720 Tobacco use: Secondary | ICD-10-CM | POA: Diagnosis not present

## 2021-07-19 DIAGNOSIS — Z23 Encounter for immunization: Secondary | ICD-10-CM

## 2021-07-19 DIAGNOSIS — R0683 Snoring: Secondary | ICD-10-CM

## 2021-07-19 MED ORDER — LISINOPRIL 20 MG PO TABS: 20.0000 mg | ORAL_TABLET | Freq: Every day | ORAL | 1 refills | Status: DC

## 2021-07-19 NOTE — Telephone Encounter (Signed)
See note from today

## 2021-07-19 NOTE — Assessment & Plan Note (Signed)
Hx of snoring and recent 2-3 months fatigue. Epworth 6 but with HTN and snoring and fatigue concerning for possible OSA. Though he is due for sinus surgery so snoring could be 2/2 to this. Given dramatic change in BP did advise evaluation and referral placed but if quick response and fatigue/snoring resolve with surgery could watch and wait.

## 2021-07-19 NOTE — Progress Notes (Signed)
Subjective:     [name] is a 55 y.o. male presenting for Follow-up (BP )     HPI  #HTN - endorses 2 weeks of HA - gets sinus HA - pressure behind the eyes - some anxiety over the last couple of weeks - went for pre-op for surgery and his BP was high and told he couldn't do surgery - no recent BP monitoring - getting sinus surgery  - fatigue - for a few months - gets plenty of sleep, feeling tired and fatigue  Snores at night No hx of OSA work-up Takes an hour to wake up in the morning  Was on BP medication about 15 years ago Ankle swelling with Calcium Channel blocker  Home readings 166/98 or 177/109   Review of Systems  Constitutional:  Positive for fatigue.  Eyes:  Negative for visual disturbance.  Respiratory:  Negative for shortness of breath.   Cardiovascular:  Negative for chest pain.  Neurological:  Positive for headaches.    Social History   Tobacco Use  Smoking Status Never  Smokeless Tobacco Current   Types: Snuff        Objective:    BP Readings from Last 3 Encounters:  07/19/21 (!) 162/110  02/20/21 110/76  12/26/20 112/75   Wt Readings from Last 3 Encounters:  07/19/21 218 lb 4 oz (99 kg)  02/20/21 218 lb 8 oz (99.1 kg)  12/26/20 205 lb (93 kg)    BP (!) 162/110   Pulse 97   Temp (!) 97.1 F (36.2 C) (Temporal)   Ht 5' 9.5" (1.765 m)   Wt 218 lb 4 oz (99 kg)   SpO2 98%   BMI 31.77 kg/m    Physical Exam Constitutional:      Appearance: Normal appearance. He is not ill-appearing or diaphoretic.  HENT:     Right Ear: External ear normal.     Left Ear: External ear normal.     Nose: Nose normal.  Eyes:     General: No scleral icterus.    Extraocular Movements: Extraocular movements intact.     Conjunctiva/sclera: Conjunctivae normal.  Neck:     Thyroid: No thyroid mass, thyromegaly or thyroid tenderness.  Cardiovascular:     Rate and Rhythm: Normal rate and regular rhythm.     Heart sounds: No murmur  heard. Pulmonary:     Effort: Pulmonary effort is normal. No respiratory distress.     Breath sounds: Normal breath sounds. No wheezing.  Musculoskeletal:     Cervical back: Neck supple.  Skin:    General: Skin is warm and dry.  Neurological:     Mental Status: He is alert. Mental status is at baseline.  Psychiatric:        Mood and Affect: Mood normal.     Epworth: 6  EKG: NSR, no ST changes, no Twave abnormalities, Lead II with bifid p wave though some artifact in lead so ?LAE     Assessment & Plan:   Problem List Items Addressed This Visit       Cardiovascular and Mediastinum   Essential hypertension - Primary    Poorly controlled. Pt with remote history of HTN and now with newly severely elevated. He notes HA but also due for sinus surgery so this is consistent with his sinus issues. EKG overall reassuring. Labs today to assess for secondary causes. He does snore (see plan). Start lisinopril 20 mg. Ankle swelling with amlodipine in the past. No new medication  but is taking tumeric. Mychart in 1 week with home monitoring. Discussed we will work to get under control by surgery but it will be on a week by week basis. Due for surgery in 3 weeks. Encouraged tobacco cessation, exercise, and DASH diet.       Relevant Medications   lisinopril (ZESTRIL) 20 MG tablet   Other Relevant Orders   EKG 12-Lead   CBC with Differential   Comprehensive metabolic panel   Hemoglobin A1c   Lipid panel   TSH     Other   Chewing tobacco use    Encourage cessation. He will cut back      Snoring    Hx of snoring and recent 2-3 months fatigue. Epworth 6 but with HTN and snoring and fatigue concerning for possible OSA. Though he is due for sinus surgery so snoring could be 2/2 to this. Given dramatic change in BP did advise evaluation and referral placed but if quick response and fatigue/snoring resolve with surgery could watch and wait.       Relevant Orders   Ambulatory referral to  Pulmonology   Abnormal EKG    Possible Left atrial enlargement. Discussed will likely consider ECHO in the future but will get initial work-up and start treatment and reassess if limited response or fatigue persisting with improvement in BP      Other Visit Diagnoses     Need for influenza vaccination       Relevant Orders   Flu Vaccine QUAD 20moIM (Fluarix, Fluzone & Alfiuria Quad PF) (Completed)   Other fatigue       Relevant Orders   Ambulatory referral to Pulmonology   Vitamin D, 25-hydroxy        Return in about 4 weeks (around 08/16/2021) for Blood pressure check.  JLesleigh Noe MD  This visit occurred during the SARS-CoV-2 public health emergency.  Safety protocols were in place, including screening questions prior to the visit, additional usage of staff PPE, and extensive cleaning of exam room while observing appropriate contact time as indicated for disinfecting solutions.

## 2021-07-19 NOTE — Assessment & Plan Note (Signed)
Encourage cessation. He will cut back

## 2021-07-19 NOTE — Patient Instructions (Signed)
#  Hypertension - Start lisinopril 20 mg daily - update via Mychart with your blood pressure readings in 1 week   Your blood pressure high.   High blood pressure increases your risk for heart attack and stroke.    Please check your blood pressure 4 times a week.   To check your blood pressure 1) Sit in a quiet and relaxed place for 5 minutes 2) Make sure your feet are flat on the ground 3) Consider checking first thing in the morning   Normal blood pressure is less than 140/90 Ideally you blood pressure should be around 120/80  Other ways you can reduce your blood pressure:  1) Regular exercise -- Try to get 150 minutes (30 minutes, 5 days a week) of moderate to vigorous aerobic excercise -- Examples: brisk walking (2.5 miles per hour), water aerobics, dancing, gardening, tennis, biking slower than 10 miles per hour 2) DASH Diet - low fat meats, more fresh fruits and vegetables, whole grains, low salt 3) Quit smoking if you smoke 4) Loose 5-10% of your body weight

## 2021-07-19 NOTE — Assessment & Plan Note (Signed)
Poorly controlled. Pt with remote history of HTN and now with newly severely elevated. He notes HA but also due for sinus surgery so this is consistent with his sinus issues. EKG overall reassuring. Labs today to assess for secondary causes. He does snore (see plan). Start lisinopril 20 mg. Ankle swelling with amlodipine in the past. No new medication but is taking tumeric. Mychart in 1 week with home monitoring. Discussed we will work to get under control by surgery but it will be on a week by week basis. Due for surgery in 3 weeks. Encouraged tobacco cessation, exercise, and DASH diet.

## 2021-07-19 NOTE — Assessment & Plan Note (Signed)
Possible Left atrial enlargement. Discussed will likely consider ECHO in the future but will get initial work-up and start treatment and reassess if limited response or fatigue persisting with improvement in BP

## 2021-07-20 LAB — COMPREHENSIVE METABOLIC PANEL
ALT: 40 IU/L (ref 0–44)
AST: 24 IU/L (ref 0–40)
Albumin/Globulin Ratio: 2.8 — ABNORMAL HIGH (ref 1.2–2.2)
Albumin: 5 g/dL — ABNORMAL HIGH (ref 3.8–4.9)
Alkaline Phosphatase: 73 IU/L (ref 44–121)
BUN/Creatinine Ratio: 17 (ref 9–20)
BUN: 18 mg/dL (ref 6–24)
Bilirubin Total: 0.5 mg/dL (ref 0.0–1.2)
CO2: 23 mmol/L (ref 20–29)
Calcium: 10 mg/dL (ref 8.7–10.2)
Chloride: 102 mmol/L (ref 96–106)
Creatinine, Ser: 1.04 mg/dL (ref 0.76–1.27)
Globulin, Total: 1.8 g/dL (ref 1.5–4.5)
Glucose: 102 mg/dL — ABNORMAL HIGH (ref 65–99)
Potassium: 4.9 mmol/L (ref 3.5–5.2)
Sodium: 141 mmol/L (ref 134–144)
Total Protein: 6.8 g/dL (ref 6.0–8.5)
eGFR: 85 mL/min/{1.73_m2} (ref >59)

## 2021-07-20 LAB — LIPID PANEL
Chol/HDL Ratio: 5.8 ratio — ABNORMAL HIGH (ref 0.0–5.0)
Cholesterol, Total: 236 mg/dL — ABNORMAL HIGH (ref 100–199)
HDL: 41 mg/dL (ref >39)
LDL Chol Calc (NIH): 145 mg/dL — ABNORMAL HIGH (ref 0–99)
Triglycerides: 271 mg/dL — ABNORMAL HIGH (ref 0–149)
VLDL Cholesterol Cal: 50 mg/dL — ABNORMAL HIGH (ref 5–40)

## 2021-07-20 LAB — CBC WITH DIFFERENTIAL/PLATELET
Basophils Absolute: 0.1 10*3/uL (ref 0.0–0.2)
Basos: 1 %
EOS (ABSOLUTE): 0.2 10*3/uL (ref 0.0–0.4)
Eos: 3 %
Hematocrit: 44.9 % (ref 37.5–51.0)
Hemoglobin: 15.1 g/dL (ref 13.0–17.7)
Immature Grans (Abs): 0 10*3/uL (ref 0.0–0.1)
Immature Granulocytes: 0 %
Lymphocytes Absolute: 2.1 10*3/uL (ref 0.7–3.1)
Lymphs: 26 %
MCH: 28.3 pg (ref 26.6–33.0)
MCHC: 33.6 g/dL (ref 31.5–35.7)
MCV: 84 fL (ref 79–97)
Monocytes Absolute: 0.5 10*3/uL (ref 0.1–0.9)
Monocytes: 6 %
Neutrophils Absolute: 5 10*3/uL (ref 1.4–7.0)
Neutrophils: 64 %
Platelets: 258 10*3/uL (ref 150–450)
RBC: 5.34 x10E6/uL (ref 4.14–5.80)
RDW: 13.7 % (ref 11.6–15.4)
WBC: 7.9 10*3/uL (ref 3.4–10.8)

## 2021-07-20 LAB — TSH: TSH: 2.3 u[IU]/mL (ref 0.450–4.500)

## 2021-07-20 LAB — HEMOGLOBIN A1C
Est. average glucose Bld gHb Est-mCnc: 114 mg/dL
Hgb A1c MFr Bld: 5.6 % (ref 4.8–5.6)

## 2021-07-20 LAB — VITAMIN D 25 HYDROXY (VIT D DEFICIENCY, FRACTURES): Vit D, 25-Hydroxy: 50.3 ng/mL (ref 30.0–100.0)

## 2021-07-25 ENCOUNTER — Encounter: Payer: Self-pay | Admitting: Family Medicine

## 2021-08-05 HISTORY — PX: NASAL SINUS SURGERY: SHX719

## 2021-08-11 ENCOUNTER — Other Ambulatory Visit: Payer: Self-pay | Admitting: Family Medicine

## 2021-08-11 DIAGNOSIS — I10 Essential (primary) hypertension: Secondary | ICD-10-CM

## 2021-08-15 ENCOUNTER — Telehealth: Payer: Self-pay

## 2021-08-15 NOTE — Telephone Encounter (Signed)
Pt already has in office appt with Dr Einar Pheasant on 08/16/21 at 9:40. Per access nurse note pt was given care advice while on phone with access nurse. Sending note to Dr Sherrilyn Rist CMA and will teams [place].

## 2021-08-15 NOTE — Telephone Encounter (Signed)
[facility] - Client TELEPHONE ADVICE RECORD [facility] Patient Name: [name] Gender: Male DOB: 1966-08-17 Age: 55 Y 2 M 3 D Return Phone Number: [phone] (Primary) Address: City/ State/ Zip: [place] [address] Client [facility] - Client Client Site [facility] Physician Waunita Schooner- MD Contact Type Call Who Is Calling Patient / Member / Family / Caregiver Call Type Triage / Clinical Relationship To Patient Self Return Phone Number [phone] (Primary) Chief Complaint Blood Pressure High Reason for Call Symptomatic / Request for [place] has high blood pressure. Translation No Nurse Assessment Nurse: Thad Ranger, RN, Denise Date/Time (Eastern Time): 08/15/2021 12:51:02 PM Confirm and document reason for call. If symptomatic, describe symptoms. ---Caller has high blood pressure and current bp is 182/110 and he has taken his med this am. Does the patient have any new or worsening symptoms? ---Yes Will a triage be completed? ---Yes Related visit to physician within the last 2 weeks? ---Yes Does the PT have any chronic conditions? (i.e. diabetes, asthma, this includes High risk factors for pregnancy, etc.) ---Yes List chronic conditions. ---HTN Is this a behavioral health or substance abuse call? ---No Guidelines Guideline Title Affirmed Question Affirmed Notes Nurse Date/Time (Eastern Time) Blood Pressure - High Systolic BP >= 037 OR Diastolic >= 096 Carmon, RN, Denise 08/15/2021 12:52:16 PM Disp. Time Eilene Ghazi Time) Disposition Final User 08/15/2021 12:32:28 PM Send To [place], RN, [place] 08/15/2021 12:54:12 PM See PCP within 24 Hours Yes Carmon, RN, Yevette Edwards Disagree/Comply Comply PLEASE NOTE: All timestamps contained within this report are represented as [place] Standard Time. CONFIDENTIALTY NOTICE: This fax  transmission is intended only for the addressee. It contains information that is legally privileged, confidential or otherwise protected from use or disclosure. If you are not the intended recipient, you are strictly prohibited from reviewing, disclosing, copying using or disseminating any of this information or taking any action in reliance on or regarding this information. If you have received this fax in error, please notify us immediately by telephone so that we can arrange for its return to [place]. Phone: [phone], Toll-Free: [phone], Fax: [phone] Page: 2 of 2 Call Id: [id number] [place] Understands Yes PreDisposition Call Doctor Care Advice Given Per Guideline SEE PCP WITHIN 24 HOURS: CALL BACK IF: * You become worse CARE ADVICE given per High Blood Pressure (Adult) guideline. Referrals REFERRED TO PCP OFFIC

## 2021-08-15 NOTE — Telephone Encounter (Signed)
Noted will see pt tomorrow 

## 2021-08-16 ENCOUNTER — Ambulatory Visit: Payer: POS | Admitting: Family Medicine

## 2021-08-16 ENCOUNTER — Other Ambulatory Visit: Payer: Self-pay

## 2021-08-16 VITALS — BP 148/100 | HR 98 | Temp 97.0°F | Ht 69.5 in | Wt 216.0 lb

## 2021-08-16 DIAGNOSIS — I10 Essential (primary) hypertension: Secondary | ICD-10-CM

## 2021-08-16 MED ORDER — HYDROCHLOROTHIAZIDE 25 MG PO TABS: 25.0000 mg | ORAL_TABLET | Freq: Every day | ORAL | 0 refills | Status: DC

## 2021-08-16 NOTE — Addendum Note (Signed)
Addended by: Waunita Schooner R on: 08/16/2021 01:46 PM   Modules accepted: Orders

## 2021-08-16 NOTE — Progress Notes (Signed)
Subjective:     [name] is a 55 y.o. male presenting for Hypertension     Hypertension Pertinent negatives include no chest pain, headaches or shortness of breath.   #HTN - feeling well - still has some anxiety - had nasal surgery - spent the night - went well - did have some post-op pain but little need for oxycodone - mostly tylenol - 182/110 at the ENT - arm check, did 3 readings and it improved some - told him to go to PCP or urgent care - has been checking at home - 2 pm on wrist cuff 144/89, this morning 141/89 - did eat a lot sodium the night  - Has been taking aleve - for pain - is taking lisinopril   Review of Systems  Eyes:  Negative for visual disturbance.  Respiratory:  Negative for shortness of breath.   Cardiovascular:  Negative for chest pain.  Neurological:  Negative for headaches.    Social History   Tobacco Use  Smoking Status Never  Smokeless Tobacco Current   Types: Snuff        Objective:    BP Readings from Last 3 Encounters:  08/16/21 (!) 148/100  07/19/21 (!) 162/110  02/20/21 110/76   Wt Readings from Last 3 Encounters:  08/16/21 216 lb (98 kg)  07/19/21 218 lb 4 oz (99 kg)  02/20/21 218 lb 8 oz (99.1 kg)    BP (!) 148/100   Pulse 98   Temp (!) 97 F (36.1 C) (Temporal)   Ht 5' 9.5" (1.765 m)   Wt 216 lb (98 kg)   SpO2 99%   BMI 31.44 kg/m    Physical Exam Constitutional:      Appearance: Normal appearance. He is not ill-appearing or diaphoretic.  HENT:     Right Ear: External ear normal.     Left Ear: External ear normal.     Nose: Nose normal.  Eyes:     General: No scleral icterus.    Extraocular Movements: Extraocular movements intact.     Conjunctiva/sclera: Conjunctivae normal.  Cardiovascular:     Rate and Rhythm: Normal rate and regular rhythm.     Heart sounds: No murmur heard. Pulmonary:     Effort: Pulmonary effort is normal. No respiratory distress.     Breath sounds: Normal breath sounds.  No wheezing.  Musculoskeletal:     Cervical back: Neck supple.  Skin:    General: Skin is warm and dry.  Neurological:     Mental Status: He is alert. Mental status is at baseline.  Psychiatric:        Mood and Affect: Mood normal.          Assessment & Plan:   Problem List Items Addressed This Visit       Cardiovascular and Mediastinum   Essential hypertension - Primary    BP elevated. Cont lisinopril 20 mg. Start hctz 25 mg. Return 1 week for labs. Mychart update with home readings in 1 week. If still elevated and labs stable anticipate increasing lisinopril. Suspect he may have an aspect of whitecoat HTN in addition vs pain with recent surgery      Relevant Medications   hydrochlorothiazide (HYDRODIURIL) 25 MG tablet     Return in about 4 weeks (around 09/13/2021) for for blood pressure check.  Lesleigh Noe, MD  This visit occurred during the SARS-CoV-2 public health emergency.  Safety protocols were in place, including screening questions prior to the  visit, additional usage of staff PPE, and extensive cleaning of exam room while observing appropriate contact time as indicated for disinfecting solutions.

## 2021-08-16 NOTE — Patient Instructions (Signed)
Blood pressure - Continue lisinopril 20 mg  - Start Hydrochlorothiazide 25 mg - Return in 1 week for labs - Update via mychart in 1 week with home readings    DASH Eating Plan DASH stands for Dietary Approaches to Stop Hypertension. The DASH eating plan is a healthy eating plan that has been shown to: Reduce high blood pressure (hypertension). Reduce your risk for type 2 diabetes, heart disease, and stroke. Help with weight loss. What are tips for following this plan? Reading food labels Check food labels for the amount of salt (sodium) per serving. Choose foods with less than 5 percent of the Daily Value of sodium. Generally, foods with less than 300 milligrams (mg) of sodium per serving fit into this eating plan. To find whole grains, look for the word "whole" as the first word in the ingredient list. Shopping Buy products labeled as "low-sodium" or "no salt added." Buy fresh foods. Avoid canned foods and pre-made or frozen meals. Cooking Avoid adding salt when cooking. Use salt-free seasonings or herbs instead of table salt or sea salt. Check with your health care provider or pharmacist before using salt substitutes. Do not fry foods. Cook foods using healthy methods such as baking, boiling, grilling, roasting, and broiling instead. Cook with heart-healthy oils, such as olive, canola, avocado, soybean, or sunflower oil. Meal planning  Eat a balanced diet that includes: 4 or more servings of fruits and 4 or more servings of vegetables each day. Try to fill one-half of your plate with fruits and vegetables. 6-8 servings of whole grains each day. Less than 6 oz (170 g) of lean meat, poultry, or fish each day. A 3-oz (85-g) serving of meat is about the same size as a deck of cards. One egg equals 1 oz (28 g). 2-3 servings of low-fat dairy each day. One serving is 1 cup (237 mL). 1 serving of nuts, seeds, or beans 5 times each week. 2-3 servings of heart-healthy fats. Healthy fats  called omega-3 fatty acids are found in foods such as walnuts, flaxseeds, fortified milks, and eggs. These fats are also found in cold-water fish, such as sardines, salmon, and mackerel. Limit how much you eat of: Canned or prepackaged foods. Food that is high in trans fat, such as some fried foods. Food that is high in saturated fat, such as fatty meat. Desserts and other sweets, sugary drinks, and other foods with added sugar. Full-fat dairy products. Do not salt foods before eating. Do not eat more than 4 egg yolks a week. Try to eat at least 2 vegetarian meals a week. Eat more home-cooked food and less restaurant, buffet, and fast food. Lifestyle When eating at a restaurant, ask that your food be prepared with less salt or no salt, if possible. If you drink alcohol: Limit how much you use to: 0-1 drink a day for women who are not pregnant. 0-2 drinks a day for men. Be aware of how much alcohol is in your drink. In the [place] one drink equals one 12 oz bottle of beer (355 mL), one 5 oz glass of wine (148 mL), or one 1 oz glass of hard liquor (44 mL). General information Avoid eating more than 2,300 mg of salt a day. If you have hypertension, you may need to reduce your sodium intake to 1,500 mg a day. Work with your health care provider to maintain a healthy body weight or to lose weight. Ask what an ideal weight is for you. Get at least 30  minutes of exercise that causes your heart to beat faster (aerobic exercise) most days of the week. Activities may include walking, swimming, or biking. Work with your health care provider or dietitian to adjust your eating plan to your individual calorie needs. What foods should I eat? Fruits All fresh, dried, or frozen fruit. Canned fruit in natural juice (without added sugar). Vegetables Fresh or frozen vegetables (raw, steamed, roasted, or grilled). Low-sodium or reduced-sodium tomato and vegetable juice. Low-sodium or reduced-sodium tomato sauce  and tomato paste. Low-sodium or reduced-sodium canned vegetables. Grains Whole-grain or whole-wheat bread. Whole-grain or whole-wheat pasta. Brown rice. [place]. Bulgur. Whole-grain and low-sodium cereals. Pita bread. Low-fat, low-sodium crackers. Whole-wheat flour tortillas. Meats and other proteins Skinless chicken or [place]. Ground chicken or [place]. Pork with fat trimmed off. Fish and seafood. Egg whites. Dried beans, peas, or lentils. Unsalted nuts, nut butters, and seeds. Unsalted canned beans. Lean cuts of beef with fat trimmed off. Low-sodium, lean precooked or cured meat, such as sausages or meat loaves. Dairy Low-fat (1%) or fat-free (skim) milk. Reduced-fat, low-fat, or fat-free cheeses. Nonfat, low-sodium ricotta or cottage cheese. Low-fat or nonfat yogurt. Low-fat, low-sodium cheese. Fats and oils Soft margarine without trans fats. Vegetable oil. Reduced-fat, low-fat, or light mayonnaise and salad dressings (reduced-sodium). Canola, safflower, olive, avocado, soybean, and sunflower oils. Avocado. Seasonings and condiments Herbs. Spices. Seasoning mixes without salt. Other foods Unsalted popcorn and pretzels. Fat-free sweets. The items listed above may not be a complete list of foods and beverages you can eat. Contact a dietitian for more information. What foods should I avoid? Fruits Canned fruit in a light or heavy syrup. Fried fruit. Fruit in cream or butter sauce. Vegetables Creamed or fried vegetables. Vegetables in a cheese sauce. Regular canned vegetables (not low-sodium or reduced-sodium). Regular canned tomato sauce and paste (not low-sodium or reduced-sodium). Regular tomato and vegetable juice (not low-sodium or reduced-sodium). Angie Fava. Olives. Grains Baked goods made with fat, such as croissants, muffins, or some breads. Dry pasta or rice meal packs. Meats and other proteins Fatty cuts of meat. Ribs. Fried meat. Berniece Salines. Bologna, salami, and other precooked or  cured meats, such as sausages or meat loaves. Fat from the back of a pig (fatback). Bratwurst. Salted nuts and seeds. Canned beans with added salt. Canned or smoked fish. Whole eggs or egg yolks. Chicken or [place] with skin. Dairy Whole or 2% milk, cream, and half-and-half. Whole or full-fat cream cheese. Whole-fat or sweetened yogurt. Full-fat cheese. Nondairy creamers. Whipped toppings. Processed cheese and cheese spreads. Fats and oils Butter. Stick margarine. Lard. Shortening. Ghee. Bacon fat. Tropical oils, such as coconut, palm kernel, or palm oil. Seasonings and condiments Onion salt, garlic salt, seasoned salt, table salt, and sea salt. Worcestershire sauce. Tartar sauce. Barbecue sauce. Teriyaki sauce. Soy sauce, including reduced-sodium. Steak sauce. Canned and packaged gravies. Fish sauce. Oyster sauce. Cocktail sauce. Store-bought horseradish. Ketchup. Mustard. Meat flavorings and tenderizers. Bouillon cubes. Hot sauces. Pre-made or packaged marinades. Pre-made or packaged taco seasonings. Relishes. Regular salad dressings. Other foods Salted popcorn and pretzels. The items listed above may not be a complete list of foods and beverages you should avoid. Contact a dietitian for more information. Where to find more information National Heart, Lung, and Blood Institute: https://wilson-eaton.com/ American Heart Association: www.heart.org Academy of Nutrition and Dietetics: www.eatright.[place]: www.kidney.org Summary The DASH eating plan is a healthy eating plan that has been shown to reduce high blood pressure (hypertension). It may also reduce your risk for  type 2 diabetes, heart disease, and stroke. When on the DASH eating plan, aim to eat more fresh fruits and vegetables, whole grains, lean proteins, low-fat dairy, and heart-healthy fats. With the DASH eating plan, you should limit salt (sodium) intake to 2,300 mg a day. If you have hypertension, you may need to reduce  your sodium intake to 1,500 mg a day. Work with your health care provider or dietitian to adjust your eating plan to your individual calorie needs. This information is not intended to replace advice given to you by your health care provider. Make sure you discuss any questions you have with your health care provider. Document Revised: 09/25/2019 Document Reviewed: 09/25/2019 Elsevier Patient Education  2022 [employer].

## 2021-08-16 NOTE — Assessment & Plan Note (Signed)
BP elevated. Cont lisinopril 20 mg. Start hctz 25 mg. Return 1 week for labs. Mychart update with home readings in 1 week. If still elevated and labs stable anticipate increasing lisinopril. Suspect he may have an aspect of whitecoat HTN in addition vs pain with recent surgery

## 2021-08-17 ENCOUNTER — Ambulatory Visit: Payer: POS | Admitting: Family Medicine

## 2021-08-22 ENCOUNTER — Other Ambulatory Visit (INDEPENDENT_AMBULATORY_CARE_PROVIDER_SITE_OTHER): Payer: POS

## 2021-08-22 ENCOUNTER — Other Ambulatory Visit: Payer: Self-pay

## 2021-08-22 DIAGNOSIS — I10 Essential (primary) hypertension: Secondary | ICD-10-CM | POA: Diagnosis not present

## 2021-08-22 NOTE — Addendum Note (Signed)
Addended by: Leeanne Rio on: 08/22/2021 10:08 AM   Modules accepted: Orders

## 2021-08-23 LAB — BASIC METABOLIC PANEL
BUN/Creatinine Ratio: 18 (ref 9–20)
BUN: 17 mg/dL (ref 6–24)
CO2: 20 mmol/L (ref 20–29)
Calcium: 10.2 mg/dL (ref 8.7–10.2)
Chloride: 100 mmol/L (ref 96–106)
Creatinine, Ser: 0.96 mg/dL (ref 0.76–1.27)
Glucose: 139 mg/dL — ABNORMAL HIGH (ref 70–99)
Potassium: 4.9 mmol/L (ref 3.5–5.2)
Sodium: 141 mmol/L (ref 134–144)
eGFR: 94 mL/min/{1.73_m2} (ref >59)

## 2021-09-06 ENCOUNTER — Other Ambulatory Visit: Payer: Self-pay | Admitting: Family Medicine

## 2021-09-06 DIAGNOSIS — I10 Essential (primary) hypertension: Secondary | ICD-10-CM

## 2021-10-08 ENCOUNTER — Other Ambulatory Visit: Payer: Self-pay | Admitting: Family Medicine

## 2021-10-08 DIAGNOSIS — I10 Essential (primary) hypertension: Secondary | ICD-10-CM

## 2021-10-11 NOTE — Telephone Encounter (Signed)
Called patient and left vm to call bck

## 2021-10-12 NOTE — Telephone Encounter (Signed)
2nd attempt to call pt to get a F/u appointment

## 2021-11-03 ENCOUNTER — Other Ambulatory Visit: Payer: Self-pay | Admitting: Family Medicine

## 2021-11-03 DIAGNOSIS — I10 Essential (primary) hypertension: Secondary | ICD-10-CM

## 2021-11-03 NOTE — Telephone Encounter (Signed)
Mychart message sent to pt asking for BP readings

## 2021-11-10 NOTE — Telephone Encounter (Signed)
Detailed message left on identifiable machine belonging to the patient. Advised to call with recent blood pressure readings at his convenience so we could work on medication refills.

## 2022-02-14 ENCOUNTER — Other Ambulatory Visit: Payer: Self-pay | Admitting: Family Medicine

## 2022-02-14 DIAGNOSIS — I10 Essential (primary) hypertension: Secondary | ICD-10-CM

## 2022-02-22 ENCOUNTER — Other Ambulatory Visit: Payer: Self-pay | Admitting: Family Medicine

## 2022-02-22 DIAGNOSIS — E7800 Pure hypercholesterolemia, unspecified: Secondary | ICD-10-CM

## 2022-03-09 ENCOUNTER — Other Ambulatory Visit: Payer: Self-pay | Admitting: Family Medicine

## 2022-03-09 DIAGNOSIS — E7800 Pure hypercholesterolemia, unspecified: Secondary | ICD-10-CM

## 2022-08-20 ENCOUNTER — Encounter: Payer: Self-pay | Admitting: Family Medicine

## 2023-03-06 ENCOUNTER — Encounter: Payer: Self-pay | Admitting: Internal Medicine

## 2023-11-05 ENCOUNTER — Emergency Department (HOSPITAL_BASED_OUTPATIENT_CLINIC_OR_DEPARTMENT_OTHER): Payer: 59

## 2023-11-05 ENCOUNTER — Encounter (HOSPITAL_BASED_OUTPATIENT_CLINIC_OR_DEPARTMENT_OTHER): Payer: Self-pay | Admitting: Emergency Medicine

## 2023-11-05 ENCOUNTER — Other Ambulatory Visit: Payer: Self-pay

## 2023-11-05 DIAGNOSIS — Z20822 Contact with and (suspected) exposure to covid-19: Secondary | ICD-10-CM | POA: Insufficient documentation

## 2023-11-05 DIAGNOSIS — R519 Headache, unspecified: Secondary | ICD-10-CM | POA: Diagnosis present

## 2023-11-05 DIAGNOSIS — Z79899 Other long term (current) drug therapy: Secondary | ICD-10-CM | POA: Diagnosis not present

## 2023-11-05 DIAGNOSIS — R630 Anorexia: Secondary | ICD-10-CM | POA: Insufficient documentation

## 2023-11-05 DIAGNOSIS — R5383 Other fatigue: Secondary | ICD-10-CM | POA: Diagnosis not present

## 2023-11-05 DIAGNOSIS — I10 Essential (primary) hypertension: Secondary | ICD-10-CM | POA: Diagnosis not present

## 2023-11-05 LAB — BASIC METABOLIC PANEL
Anion gap: 10 (ref 5–15)
BUN: 17 mg/dL (ref 6–20)
CO2: 26 mmol/L (ref 22–32)
Calcium: 9.6 mg/dL (ref 8.9–10.3)
Chloride: 103 mmol/L (ref 98–111)
Creatinine, Ser: 1.21 mg/dL (ref 0.61–1.24)
GFR, Estimated: >60 mL/min (ref >60)
Glucose, Bld: 99 mg/dL (ref 70–99)
Potassium: 4.5 mmol/L (ref 3.5–5.1)
Sodium: 139 mmol/L (ref 135–145)

## 2023-11-05 LAB — RESP PANEL BY RT-PCR (RSV, FLU A&B, COVID)  RVPGX2
Influenza A by PCR: NEGATIVE
Influenza B by PCR: NEGATIVE
Resp Syncytial Virus by PCR: NEGATIVE
SARS Coronavirus 2 by RT PCR: NEGATIVE

## 2023-11-05 LAB — CBC WITH DIFFERENTIAL/PLATELET
Abs Immature Granulocytes: 0.02 10*3/uL (ref 0.00–0.07)
Basophils Absolute: 0.1 10*3/uL (ref 0.0–0.1)
Basophils Relative: 1 %
Eosinophils Absolute: 0.3 10*3/uL (ref 0.0–0.5)
Eosinophils Relative: 3 %
HCT: 47.6 % (ref 39.0–52.0)
Hemoglobin: 16.5 g/dL (ref 13.0–17.0)
Immature Granulocytes: 0 %
Lymphocytes Relative: 21 %
Lymphs Abs: 1.8 10*3/uL (ref 0.7–4.0)
MCH: 29 pg (ref 26.0–34.0)
MCHC: 34.7 g/dL (ref 30.0–36.0)
MCV: 83.7 fL (ref 80.0–100.0)
Monocytes Absolute: 0.5 10*3/uL (ref 0.1–1.0)
Monocytes Relative: 5 %
Neutro Abs: 6.2 10*3/uL (ref 1.7–7.7)
Neutrophils Relative %: 70 %
Platelets: 254 10*3/uL (ref 150–400)
RBC: 5.69 MIL/uL (ref 4.22–5.81)
RDW: 13.8 % (ref 11.5–15.5)
WBC: 8.9 10*3/uL (ref 4.0–10.5)
nRBC: 0 % (ref 0.0–0.2)

## 2023-11-05 MED ORDER — IOHEXOL 300 MG/ML  SOLN
100.0000 mL | Freq: Once | INTRAMUSCULAR | Status: AC | PRN
  Administered 2023-11-05: 75 mL via INTRAVENOUS

## 2023-11-05 NOTE — ED Triage Notes (Signed)
Migraine x 1 week Worse today  Some blurry vision, feeling off balance  when he woke  Last normal (no blurry vision or balance 5 AM) Fatigue and anxiety Ringing in ears

## 2023-11-06 ENCOUNTER — Emergency Department (HOSPITAL_BASED_OUTPATIENT_CLINIC_OR_DEPARTMENT_OTHER)
Admission: EM | Admit: 2023-11-06 | Discharge: 2023-11-06 | Disposition: A | Discharge status: Home / Self Care | Payer: 59 | Attending: Emergency Medicine | Admitting: Emergency Medicine

## 2023-11-06 DIAGNOSIS — R519 Headache, unspecified: Secondary | ICD-10-CM

## 2023-11-06 MED ORDER — LACTATED RINGERS IV BOLUS
1000.0000 mL | Freq: Once | INTRAVENOUS | Status: DC
Start: 2023-11-06 — End: 2023-11-06

## 2023-11-06 MED ORDER — METOCLOPRAMIDE HCL 5 MG/ML IJ SOLN
10.0000 mg | Freq: Once | INTRAMUSCULAR | Status: AC
  Administered 2023-11-06: 10 mg via INTRAVENOUS
  Filled 2023-11-06: qty 2

## 2023-11-06 MED ORDER — KETOROLAC TROMETHAMINE 15 MG/ML IJ SOLN
15.0000 mg | Freq: Once | INTRAMUSCULAR | Status: AC
  Administered 2023-11-06: 15 mg via INTRAVENOUS
  Filled 2023-11-06: qty 1

## 2023-11-06 MED ORDER — DIPHENHYDRAMINE HCL 50 MG/ML IJ SOLN
12.5000 mg | Freq: Once | INTRAMUSCULAR | Status: AC
  Administered 2023-11-06: 12.5 mg via INTRAVENOUS
  Filled 2023-11-06: qty 1

## 2023-11-06 MED ORDER — LACTATED RINGERS IV BOLUS
1000.0000 mL | Freq: Once | INTRAVENOUS | Status: AC
Start: 2023-11-06 — End: 2023-11-06
  Administered 2023-11-06: 1000 mL via INTRAVENOUS

## 2023-11-06 NOTE — ED Provider Notes (Signed)
 [place] EMERGENCY DEPARTMENT AT [facility] Provider Note   CSN: [id number] Arrival date & time: 11/05/23  1509     History  Chief Complaint  Patient presents with   Migraine    [name] is a 58 y.o. male.   Migraine Associated symptoms include headaches.  Patient presents for headache.  Medical history includes HLD, anxiety, HTN, depression, GERD.  For the past 2 weeks, patient has had intermittent bifrontal headache.  He has been taking Excedrin Migraine with minimal relief.  Severity waxes and wanes.  He has also experienced blurry vision and felt off balance at times.  The symptoms seem to worsen with worsening headache severity.  Prior to 2 weeks ago, he would typically not get headaches.  He denies any recent trauma.  He has had decreased appetite and fatigue.  He has a history of anxiety and this has worsened over the past 2 weeks.  He did have some old Klonopin at home which he has been taking.  Currently, he endorses moderate headache.  He denies any other current symptoms.     Home Medications Prior to Admission medications   Medication Sig Start Date End Date Taking? Authorizing Provider  Ascorbic Acid (VITAMIN C PO) Take by mouth.    [provider]  Cholecalciferol (VITAMIN D3 ADULT GUMMIES PO) Take 3 tablets by mouth daily. Gummies    [provider]  Cyanocobalamin (VITAMIN B 12 PO) Take 3 tablets by mouth daily. Gummies    [provider]  ezetimibe  (ZETIA ) 10 MG tablet TAKE 1 TABLET BY MOUTH EVERY DAY. Pt needs appt with PCP for further refills. 02/22/22   Velma Raisin, MD  hydrochlorothiazide  (HYDRODIURIL ) 25 MG tablet Take 1 tablet (25 mg total) by mouth daily. 08/16/21   Velma Raisin, MD  lamoTRIgine  (LAMICTAL ) 100 MG tablet Take 2 tablets (200 mg) in the morning and then 1/2 tablet (50 mg) at bedtime 02/20/21   Velma Raisin, MD  lisinopril  (ZESTRIL ) 20 MG tablet TAKE 1 TABLET BY MOUTH EVERY DAY 11/15/21   Velma Raisin, MD  loratadine (CLARITIN) 10 MG tablet Take 10 mg by mouth daily.    [provider]  Multiple Vitamin (MULTIVITAMIN) tablet Take 1 tablet by mouth daily.    [provider]  omeprazole  (PRILOSEC) 40 MG capsule Take 1 capsule (40 mg total) by mouth 2 (two) times daily. 02/20/21   Velma Raisin, MD  Probiotic Product (PROBIOTIC-10 PO) Take by mouth.    [provider]  rosuvastatin  (CRESTOR ) 40 MG tablet Take 1 tablet (40 mg total) by mouth daily. 02/20/21   Velma Raisin, MD  Triamcinolone Acetonide (NASACORT ALLERGY 24HR NA) Place into the nose.    [provider]  Vilazodone  HCl (VIIBRYD ) 40 MG TABS TAKE 1 TABLET BY MOUTH EVERY DAY IN THE MORNING 02/20/21   Velma Raisin, MD  VITAMIN D  PO Take by mouth.    [provider]      Allergies    Bupropion hcl and Fluoxetine hcl    Review of Systems   Review of Systems  Constitutional:  Positive for appetite change and fatigue.  Eyes:  Positive for visual disturbance.  Neurological:  Positive for headaches.  Psychiatric/Behavioral:  The patient is nervous/anxious.   All other systems reviewed and are negative.   Physical Exam Updated Vital Signs BP (!) 147/92   Pulse 77   Temp 98 F (36.7 C) (Oral)   Resp 17   SpO2 99%  Physical Exam  Vitals and nursing note reviewed.  Constitutional:      General: He is not in acute distress.    Appearance: Normal appearance. He is well-developed. He is not ill-appearing, toxic-appearing or diaphoretic.  HENT:     Head: Normocephalic and atraumatic.     Right Ear: External ear normal.     Left Ear: External ear normal.     Nose: Nose normal.     Mouth/Throat:     Mouth: Mucous membranes are moist.  Eyes:     General: No visual field deficit.    Extraocular Movements: Extraocular movements intact.     Conjunctiva/sclera: Conjunctivae normal.  Cardiovascular:     Rate and Rhythm: Normal rate and regular rhythm.  Pulmonary:     Effort:  Pulmonary effort is normal. No respiratory distress.  Abdominal:     General: There is no distension.     Palpations: Abdomen is soft.     Tenderness: There is no abdominal tenderness.  Musculoskeletal:        General: No swelling. Normal range of motion.     Cervical back: Normal range of motion and neck supple.  Skin:    General: Skin is warm and dry.     Coloration: Skin is not jaundiced or pale.  Neurological:     General: No focal deficit present.     Mental Status: He is alert and oriented to person, place, and time.     Cranial Nerves: Cranial nerves 2-12 are intact. No cranial nerve deficit, dysarthria or facial asymmetry.     Sensory: Sensation is intact. No sensory deficit.     Motor: Motor function is intact. No tremor, abnormal muscle tone or pronator drift.     Coordination: Coordination is intact. Finger-Nose-Finger Test normal.  Psychiatric:        Mood and Affect: Mood normal.     ED Results / Procedures / Treatments   Labs (all labs ordered are listed, but only abnormal results are displayed) Labs Reviewed  RESP PANEL BY RT-PCR (RSV, FLU A&B, COVID)  [id number]  CBC WITH DIFFERENTIAL/PLATELET  BASIC METABOLIC PANEL    EKG None  Radiology CT Head W or Wo Contrast Result Date: 11/05/2023 CLINICAL DATA:  Headache and balance disturbance EXAM: CT HEAD WITHOUT AND WITH CONTRAST TECHNIQUE: Contiguous axial images were obtained from the base of the skull through the vertex without and with intravenous contrast. RADIATION DOSE REDUCTION: This exam was performed according to the departmental dose-optimization program which includes automated exposure control, adjustment of the mA and/or kV according to patient size and/or use of iterative reconstruction technique. CONTRAST:  75mL OMNIPAQUE  IOHEXOL  300 MG/ML  SOLN COMPARISON:  None Available. FINDINGS: Brain: No mass,hemorrhage or extra-axial collection. Normal appearance of the parenchyma and CSF spaces. Vascular: No  hyperdense vessel or unexpected vascular calcification. Normal intravascular enhancement. Skull: There is thinning of the sigmoid plate on the left with postoperative changes of prior mastoidectomy. Sinuses/Orbits: No fluid levels or advanced mucosal thickening of the visualized paranasal sinuses. No mastoid or middle ear effusion. Normal orbits. Other: None. IMPRESSION: 1. No acute intracranial abnormality. 2. Mild thinning of the sigmoid plate on the left with postoperative changes of prior mastoidectomy. Electronically Signed   By: Franky Stanford M.D.   On: 11/05/2023 19:25    Procedures Procedures    Medications Ordered in ED Medications  lactated ringers  bolus 1,000 mL (1,000 mLs Intravenous Not Given 11/06/23 0237)  iohexol  (OMNIPAQUE ) 300 MG/ML solution 100 mL (75 mLs Intravenous  Contrast Given 11/05/23 1704)  lactated ringers  bolus 1,000 mL (0 mLs Intravenous Stopped 11/06/23 0223)  ketorolac  (TORADOL ) 15 MG/ML injection 15 mg (15 mg Intravenous Given 11/06/23 0116)  metoCLOPramide  (REGLAN ) injection 10 mg (10 mg Intravenous Given 11/06/23 0120)  diphenhydrAMINE  (BENADRYL ) injection 12.5 mg (12.5 mg Intravenous Given 11/06/23 0123)    ED Course/ Medical Decision Making/ A&P                                 Medical Decision Making Amount and/or Complexity of Data Reviewed Labs: ordered.  Risk Prescription drug management.   This patient presents to the ED for concern of headache, this involves an extensive number of treatment options, and is a complaint that carries with it a high risk of complications and morbidity.  The differential diagnosis includes migraine headache, tension headache, polypharmacy, medication withdrawal, neoplasm, ICH   Co morbidities that complicate the patient evaluation  HLD, anxiety, HTN, depression, GERD   Additional history obtained:  Additional history obtained from patient's significant other External records from outside source obtained and reviewed  including EMR   Lab Tests:  I Ordered, and personally interpreted labs.  The pertinent results include: Hemoglobin, no leukocytosis, normal kidney function, normal electrolytes   Imaging Studies ordered:  I ordered imaging studies including CT head I independently visualized and interpreted imaging which showed no acute findings I agree with the radiologist interpretation   Cardiac Monitoring: / EKG:  The patient was maintained on a cardiac monitor.  I personally viewed and interpreted the cardiac monitored which showed an underlying rhythm of: Sinus rhythm  Problem List / ED Course / Critical interventions / Medication management  Patient presents for symptoms over the past 2 weeks that have included bifrontal headache, fatigue, decreased appetite, blurry vision, and difficulty with balance and coordination.  Vital signs in the ED notable for mild hypertension.  Patient is well-appearing on exam.  He endorses a moderate bifrontal headache at this time.  He denies any other current symptoms.  On exam, he does not have any focal neurologic deficits.  He did undergo lab work and CT imaging of head.  Results were unremarkable.  Patient was given IV fluids for hydration and headache cocktail.  On reassessment, patient's headache was resolved.  He was ambulated with steady gait.  He does request discharge home at this time.  He was advised to follow-up with neurology.  He was discharged in stable condition. I ordered medication including IV fluids for hydration; Reglan , Benadryl , Toradol  for headache Reevaluation of the patient after these medicines showed that the patient improved I have reviewed the patients home medicines and have made adjustments as needed   Social Determinants of Health:  Has PCP        Final Clinical Impression(s) / ED Diagnoses Final diagnoses:  Bad headache    Rx / DC Orders ED Discharge Orders     None         Melvenia Motto, MD 11/06/23 [phone]

## 2023-11-06 NOTE — ED Notes (Signed)

## 2023-11-06 NOTE — Discharge Instructions (Signed)
 Call the telephone number below to set up a follow-up appointment with neurology.  Take Tylenol and ibuprofen as needed for headaches.  Stay hydrated.  Return to the emergency department for any new or worsening symptoms of concern.

## 2023-11-14 ENCOUNTER — Encounter: Payer: Self-pay | Admitting: Neurology

## 2023-11-19 ENCOUNTER — Ambulatory Visit (HOSPITAL_COMMUNITY)
Admission: EM | Admit: 2023-11-19 | Discharge: 2023-11-19 | Disposition: A | Discharge status: Home / Self Care | Payer: 59 | Attending: Psychiatry | Admitting: Psychiatry

## 2023-11-19 DIAGNOSIS — Z79899 Other long term (current) drug therapy: Secondary | ICD-10-CM | POA: Diagnosis not present

## 2023-11-19 DIAGNOSIS — F1011 Alcohol abuse, in remission: Secondary | ICD-10-CM | POA: Insufficient documentation

## 2023-11-19 DIAGNOSIS — R440 Auditory hallucinations: Secondary | ICD-10-CM | POA: Insufficient documentation

## 2023-11-19 DIAGNOSIS — F411 Generalized anxiety disorder: Secondary | ICD-10-CM | POA: Insufficient documentation

## 2023-11-19 DIAGNOSIS — F333 Major depressive disorder, recurrent, severe with psychotic symptoms: Secondary | ICD-10-CM

## 2023-11-19 DIAGNOSIS — Z818 Family history of other mental and behavioral disorders: Secondary | ICD-10-CM | POA: Insufficient documentation

## 2023-11-19 DIAGNOSIS — F4310 Post-traumatic stress disorder, unspecified: Secondary | ICD-10-CM | POA: Diagnosis not present

## 2023-11-19 DIAGNOSIS — F322 Major depressive disorder, single episode, severe without psychotic features: Secondary | ICD-10-CM | POA: Insufficient documentation

## 2023-11-19 DIAGNOSIS — R45851 Suicidal ideations: Secondary | ICD-10-CM | POA: Insufficient documentation

## 2023-11-19 LAB — POCT URINE DRUG SCREEN - MANUAL ENTRY (I-SCREEN)
POC Amphetamine UR: NOT DETECTED
POC Buprenorphine (BUP): NOT DETECTED
POC Cocaine UR: NOT DETECTED
POC Marijuana UR: POSITIVE — AB
POC Methadone UR: NOT DETECTED
POC Methamphetamine UR: NOT DETECTED
POC Morphine: NOT DETECTED
POC Oxazepam (BZO): NOT DETECTED
POC Oxycodone UR: NOT DETECTED
POC Secobarbital (BAR): NOT DETECTED

## 2023-11-19 LAB — CBC WITH DIFFERENTIAL/PLATELET
Abs Immature Granulocytes: 0.05 10*3/uL (ref 0.00–0.07)
Basophils Absolute: 0.1 10*3/uL (ref 0.0–0.1)
Basophils Relative: 1 %
Eosinophils Absolute: 0.2 10*3/uL (ref 0.0–0.5)
Eosinophils Relative: 2 %
HCT: 45.7 % (ref 39.0–52.0)
Hemoglobin: 16.3 g/dL (ref 13.0–17.0)
Immature Granulocytes: 0 %
Lymphocytes Relative: 13 %
Lymphs Abs: 1.8 10*3/uL (ref 0.7–4.0)
MCH: 29.3 pg (ref 26.0–34.0)
MCHC: 35.7 g/dL (ref 30.0–36.0)
MCV: 82 fL (ref 80.0–100.0)
Monocytes Absolute: 0.9 10*3/uL (ref 0.1–1.0)
Monocytes Relative: 7 %
Neutro Abs: 10.4 10*3/uL — ABNORMAL HIGH (ref 1.7–7.7)
Neutrophils Relative %: 77 %
Platelets: 318 10*3/uL (ref 150–400)
RBC: 5.57 MIL/uL (ref 4.22–5.81)
RDW: 13.2 % (ref 11.5–15.5)
WBC: 13.5 10*3/uL — ABNORMAL HIGH (ref 4.0–10.5)
nRBC: 0 % (ref 0.0–0.2)

## 2023-11-19 LAB — COMPREHENSIVE METABOLIC PANEL
ALT: 24 U/L (ref 0–44)
AST: 21 U/L (ref 15–41)
Albumin: 4.6 g/dL (ref 3.5–5.0)
Alkaline Phosphatase: 65 U/L (ref 38–126)
Anion gap: 12 (ref 5–15)
BUN: 15 mg/dL (ref 6–20)
CO2: 26 mmol/L (ref 22–32)
Calcium: 9.9 mg/dL (ref 8.9–10.3)
Chloride: 101 mmol/L (ref 98–111)
Creatinine, Ser: 1.07 mg/dL (ref 0.61–1.24)
GFR, Estimated: >60 mL/min (ref >60)
Glucose, Bld: 125 mg/dL — ABNORMAL HIGH (ref 70–99)
Potassium: 3.9 mmol/L (ref 3.5–5.1)
Sodium: 139 mmol/L (ref 135–145)
Total Bilirubin: 0.8 mg/dL (ref 0.0–1.2)
Total Protein: 6.7 g/dL (ref 6.5–8.1)

## 2023-11-19 LAB — URINALYSIS, COMPLETE (UACMP) WITH MICROSCOPIC
Bacteria, UA: NONE SEEN
Bilirubin Urine: NEGATIVE
Glucose, UA: NEGATIVE mg/dL
Hgb urine dipstick: NEGATIVE
Ketones, ur: NEGATIVE mg/dL
Leukocytes,Ua: NEGATIVE
Nitrite: NEGATIVE
Protein, ur: NEGATIVE mg/dL
Specific Gravity, Urine: 1.013 (ref 1.005–1.030)
pH: 5 (ref 5.0–8.0)

## 2023-11-19 LAB — LIPID PANEL
Cholesterol: 377 mg/dL — ABNORMAL HIGH (ref 0–200)
HDL: 38 mg/dL — ABNORMAL LOW (ref >40)
LDL Cholesterol: 295 mg/dL — ABNORMAL HIGH (ref 0–99)
Total CHOL/HDL Ratio: 9.9 {ratio}
Triglycerides: 219 mg/dL — ABNORMAL HIGH (ref <150)
VLDL: 44 mg/dL — ABNORMAL HIGH (ref 0–40)

## 2023-11-19 LAB — HEMOGLOBIN A1C
Hgb A1c MFr Bld: 5.1 % (ref 4.8–5.6)
Mean Plasma Glucose: 99.67 mg/dL

## 2023-11-19 LAB — ETHANOL: Alcohol, Ethyl (B): <10 mg/dL (ref <10)

## 2023-11-19 LAB — MAGNESIUM: Magnesium: 2.2 mg/dL (ref 1.7–2.4)

## 2023-11-19 LAB — PHOSPHORUS: Phosphorus: 3.2 mg/dL (ref 2.5–4.6)

## 2023-11-19 LAB — TSH: TSH: 1.085 u[IU]/mL (ref 0.350–4.500)

## 2023-11-19 MED ORDER — MAGNESIUM HYDROXIDE 400 MG/5ML PO SUSP: 30.0000 mL | Freq: Every day | ORAL | Status: DC | PRN

## 2023-11-19 MED ORDER — ACETAMINOPHEN 325 MG PO TABS: 650.0000 mg | ORAL_TABLET | Freq: Four times a day (QID) | ORAL | Status: DC | PRN

## 2023-11-19 MED ORDER — HYDROXYZINE HCL 25 MG PO TABS: 25.0000 mg | ORAL_TABLET | Freq: Three times a day (TID) | ORAL | Status: DC | PRN

## 2023-11-19 MED ORDER — ALUM & MAG HYDROXIDE-SIMETH 200-200-20 MG/5ML PO SUSP: 30.0000 mL | ORAL | Status: DC | PRN

## 2023-11-19 MED ORDER — TRAZODONE HCL 50 MG PO TABS: 50.0000 mg | ORAL_TABLET | Freq: Every evening | ORAL | Status: DC | PRN

## 2023-11-19 NOTE — ED Notes (Signed)
 Patient discharged stable with family. NP reviewed discharge instructions with patient and family. Denied si/hi/avh at time of discharged. Safety maintained.
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
## 2023-11-19 NOTE — Discharge Instructions (Addendum)
 It is imperative that you follow through with treatment recommendations within 5-7 days from the day of discharge to mitigate further risk to your safety and overall mental well-being.  A list of outpatient therapy and psychiatric providers for medication management has been provided below to get you started in finding the right provider for you.            [facility] [address] [place], [place], [address] [phone] phone (Medicare, Private insurance except Tricare, [place] [employer]wolf, and [facility])  [facility] [address] [place], [place], [address] [address] phone ([address], [employer] [facility] [place], [address], [place], [place], Friday Health Plans, [address], BCBS Healthy [place], [place], [address], [place], [place], [place], [place], Tricare, [facility], [employer], Eli Lilly and Company)  [employer] [phone] [address], Suite [place], [place], [address] [phone] phone [phone] phone [phone] fax  [facility] [address] [place], [place], [address] [phone] phone (Call to confirm insurance coverage) Consultation & Support Services     o Drop-In Hours: 1:00 PM to 5:00 PM     o Days: Monday - Thursday  Crisis Services (24/7)   [facility] [address] [place], [place], [address] [phone] phone ([address] [place], [place], [place] Medicaid, [place] and [place], [facility])      [facility] [address] [place], [place], [address] [phone] phone [email or web address]  (to complete the intake form and upload ID and insurance cards)  [facility] [address] [place], [place], [address] [phone] phone ([address], [address], [place], [address] [employer], [place], [place], [place], [facility], [place], and certain Medicaid plans)  [facility] [phone] [address] [place], [place], [address] [phone] phone [phone] fax (Medicaid, Medicare, Self-pay, call about other insurance coverage)  [facility] (age 85+) [address] [place], [place], [address] [phone] hone [phone] fax ([place], [address], [place], [place], [place], [address], [place], [place], [place], [place], certain [employer], [facility], UMR)  [employer] [address] [place], [place], [address] [phone] phone (Medicare, Medicaid, Artemio Aly, call about other insurance coverage)  [facility] [address] [place], [place], [address] [phone] phone [phone] fax (Call [phone] to see what insurance is accepted) Archer Asa, MD specializes in geropsych)  [facility], [facility]  (medication management only) [address] [place], [place], [address] [phone] phone [phone] fax ([address], Medicaid, [place], [place], [place], [place], [place], [place], [place])  [facility] (medication management only) [address] [place], [place], [address] [phone] phone [phone] fax ([address] Medicare, [place], [place], Tricare [place])  [facility] [address] [place], [place], [address] [phone] phone [phone] fax ([address], Tea Collums, [place], [place], [place], [facility], [facility] Medicaid/[place] Health Choice)  [facility] [place], [employer]. [address] [place], [place], [address] [phone] phone [phone] fax (Medicare, Medicaid, [facility])  [facility] [address] [place], [place] [address] [phone] phone ([address], [place], [place], [place], [place], Medicare, Ages, [facility]) Does genetic testing for medications; does transcranial magnetic stimulation along with basic services)  [facility] [address] [place], [place],  [address] [phone] phone (Call about insurance coverage)  [facility] [address] [place], [place], [address] [phone] phone [phone] fax (Call about insurance coverage)  [facility] [address] [place], [place], [address] [phone] phone [phone]  fax (Call about insurance coverage)  [employer] [phone] [address], [place], [address] [phone] phone (Medicaid, Tricare, [place], [place], [place])  [employer] [address] [place], [place], [address] [phone] phone (Medicaid, Medicare, call about other insurance coverage)  [facility] [address] [place], [place], [address] [phone] phone [phone] fax (Medicaid, Medicare, Tricare, call about other insurance coverage)  [facility] [address] [place], [address] [phone] phone ([address], [place], [place], [place], [place] types - Alliance, Secretary/administrator, Partners, [place], [place] Health Choice, Healthy [place], [place], [place], and Complete)  [facility] [address] [place], [place], [address] [phone] phone Completely online treatment platform Contact: Personal assistant - [employer] Specialist [phone] phone [phone] fax ([address], [place], [place], Friday Health Plan, [place], [place], [place], [place], [place], [place])

## 2023-11-19 NOTE — Progress Notes (Signed)
   11/19/23 1435  [facility] Triage Screening (Walk-ins at [facility] only)  How Did You Hear About Us ? Family/Friend  What Is the Reason for Your Visit/Call Today? Patient is a 58 yo male who presents [facility] to [facility] accompanied by his wife [name]. due to him having SI AVH. Per [name] yesterday was the first time ever having SI. Patient denies HI. He denies alcohol or substance use. The voices are telling him to end his life, and he has visions of being hit by a truck. The patient is being followed by a provider at [facility] in [place] for a diagonis of anxiety. he is prescribed Lamictal  and Vibryd.  How Long Has This Been Causing You Problems? <Week  Have You Recently Had Any Thoughts About Hurting Yourself? Yes  How long ago did you have thoughts about hurting yourself? Since yesterday  Are You Planning to Commit Suicide/Harm Yourself At This time? No  Have you Recently Had Thoughts About Hurting Someone [name]? No  Are You Planning To Harm Someone At This Time? No  Physical Abuse Denies  Verbal Abuse Denies  Sexual Abuse Denies  Exploitation of patient/patient's resources Denies  Self-Neglect Denies  Possible abuse reported to:  (N/A)  Are you currently experiencing any auditory, visual or other hallucinations? Yes  Please explain the hallucinations you are currently experiencing: voices are tell him to end his life.  See himself being run over by a truck  Have You Used Any Alcohol or Drugs in the Past 24 Hours? No  Do you have any current medical co-morbidities that require immediate attention? No  Clinician description of patient physical appearance/behavior: casually dressed, cooperative  What Do You Feel Would Help You the Most Today? Treatment for Depression or other mood problem  If access to [facility] was not available, would you have sought care in the Emergency Department? No  Determination of Need Urgent (48 hours)  Options For Referral Inpatient  Hospitalization;Medication Management  Determination of Need filed? Yes

## 2023-11-19 NOTE — ED Notes (Addendum)
 Patient A&Ox4. Denies intent to harm self/others when asked. Denies A/VH. Patient denies any physical complaints when asked. Pt has flat affect. Wife reports pt is feeling suicidal. Pt endorse passive SI and VH of voices. Support and encouragement provided. Routine safety checks conducted according to facility protocol. Encouraged patient to notify staff if thoughts of harm toward self or others arise. Patient verbalize understanding and agreement. Will continue to monitor for safety.
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
## 2023-12-24 ENCOUNTER — Ambulatory Visit: Payer: 59 | Admitting: Neurology

## 2023-12-24 ENCOUNTER — Telehealth: Payer: Self-pay | Admitting: Neurology

## 2023-12-24 ENCOUNTER — Encounter: Payer: Self-pay | Admitting: Neurology

## 2023-12-24 VITALS — BP 130/95 | HR 92 | Ht 70.0 in | Wt 193.0 lb

## 2023-12-24 DIAGNOSIS — R413 Other amnesia: Secondary | ICD-10-CM | POA: Diagnosis not present

## 2023-12-24 DIAGNOSIS — R292 Abnormal reflex: Secondary | ICD-10-CM

## 2023-12-24 DIAGNOSIS — R4189 Other symptoms and signs involving cognitive functions and awareness: Secondary | ICD-10-CM

## 2023-12-24 NOTE — Progress Notes (Unsigned)
[facility] Neurology [facility] Clinic Note - Initial Visit   Date: 12/24/2023   [name] MRN: [id number] DOB: 1966-10-04   Dear Dr. Durwin Nora:  Thank you for your kind referral of [name] for consultation of headache. Although his history is well known to you, please allow [place] to reiterate it for the purpose of our medical record. The patient was accompanied to the clinic by wife who also provides collateral information.     [name] is a 58 y.o. right-handed male with depression, GERD, ADHD, hyperlipidemia, and GERD presenting for evaluation of headache, cognitive changes, and behavior changes.   IMPRESSION/PLAN: Acute onset of behavior and cognitive changes. On exam, he scored 18/30 on MOCA with global deficits. Reflexes are brisk at the knees, otherwise, there is no upper motor neuron findings.  There is some nonphysiologic findings on exam making it difficult to discern true deficits. Although his symptoms may be psychiatric in nature, the sudden change in personality, behavior, and cognition warrants further testing as noted below:  - Check ESR, CRP, vitamin B12, folate, vitamin B1, ANA, HIV, RPR, heavy metal screen - MRI brain wwo contrast - Consider LP going forward  Further recommendations pending results.  ------------------------------------------------------------- History of present illness: Patient was in is usual state of health until December 19th, when he entered his bedroom crying because he was unable to start and drive his car.  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  Since this time, wife reports ongoing behavior changes with high anxiety, reduced conversation, cognitive changes with difficulty processing, lack of motivation, and difficulty with doing day-to-day tasks such as using his phone and driving. She recalls that he has a lot of agitation including ringing hands,  rocking, pacing the floor, which improved after anxiety medication. Wife was initially having to prompt him about bathing and eating, but this has improved slightly.  He has history of mental health disorder and periods where he has "shut down" in the past, but this time it is different.  He is seeing April Nandigam, MD at [facility] in [place].  Anxiety has improved.  She requested neurology to evaluate patient to be sure there is no other explanation for his acute cognitive change.   He has lost 20lb over the past several months.   He used to drink 3-6 beers over the weekend.  He has history of alcohol abuse about 5+ years ago.   Out-side paper records, electronic medical record, and images have been reviewed where available and summarized as:  CT head wwo contrast 11/05/2023: 1. No acute intracranial abnormality. 2. Mild thinning of the sigmoid plate on the left with postoperative changes of prior mastoidectomy.  Lab Results  Component Value Date   HGBA1C 5.1 11/19/2023   Lab Results  Component Value Date   VITAMINB12 1,918 (H) 05/11/2019   Lab Results  Component Value Date   TSH 1.085 11/19/2023   No results found for: "ESRSEDRATE", "POCTSEDRATE"  Past Medical History:  Diagnosis Date   ADHD (attention deficit hyperactivity disorder)    Allergy    Anxiety    Depression    GERD (gastroesophageal reflux disease)    Hyperlipidemia     Past Surgical History:  Procedure Laterality Date   CARPAL TUNNEL RELEASE  12/2018   CHOLESTEATOMA EXCISION  1993   left   COLONOSCOPY  2019   NASAL SINUS  SURGERY  2014   limited FESS, septoplasty, inferior turbinate reduction ([facility])   NASAL SINUS SURGERY  08/2021   septoplasty, inf turbinate resection [place] at [facility])   SPINE SURGERY  1999   Lumbar    VARICOCELE EXCISION  12/2005     Medications:  Outpatient Encounter Medications as of 12/24/2023  Medication Sig   ALPRAZolam (XANAX) 0.5 MG tablet Take 0.25-0.5 mg by  mouth every 6 (six) hours as needed.   Ascorbic Acid (VITAMIN C PO) Take by mouth.   Cholecalciferol (VITAMIN D3 ADULT GUMMIES PO) Take 3 tablets by mouth daily. Gummies   clonazePAM (KLONOPIN) 0.5 MG disintegrating tablet Take 0.5 mg by mouth every 6 (six) hours as needed. One tablet at bedtime   Cyanocobalamin (VITAMIN B 12 PO) Take 3 tablets by mouth daily. Gummies   lamoTRIgine (LAMICTAL) 100 MG tablet Take 2 tablets (200 mg) in the morning and then 1/2 tablet (50 mg) at bedtime (Patient taking differently: Take 1 1/2 tablets in the morning and then 1/2 tablet at bedtime)   loratadine (CLARITIN) 10 MG tablet Take 10 mg by mouth daily.   Triamcinolone Acetonide (NASACORT ALLERGY 24HR NA) Place into the nose.   VITAMIN D PO Take by mouth.   lisinopril (ZESTRIL) 20 MG tablet TAKE 1 TABLET BY MOUTH EVERY DAY   omeprazole (PRILOSEC) 40 MG capsule Take 1 capsule (40 mg total) by mouth 2 (two) times daily.   Probiotic Product (PROBIOTIC-10 PO) Take by mouth.   rosuvastatin (CRESTOR) 40 MG tablet Take 1 tablet (40 mg total) by mouth daily.   Vilazodone HCl (VIIBRYD) 40 MG TABS TAKE 1 TABLET BY MOUTH EVERY DAY IN THE MORNING   [DISCONTINUED] Multiple Vitamin (MULTIVITAMIN) tablet Take 1 tablet by mouth daily. (Patient not taking: Reported on 12/24/2023)   No facility-administered encounter medications on file as of 12/24/2023.    Allergies:  Allergies  Allergen Reactions   Bupropion Hcl     REACTION: anxiety   Fluoxetine Hcl     REACTION: more anxious and tired    Family History: Family History  Problem Relation Age of Onset   Hyperlipidemia Mother    Heart attack Mother 83   Hypertension Brother    Colon cancer Neg Hx    Colon polyps Neg Hx    Esophageal cancer Neg Hx    Stomach cancer Neg Hx    Rectal cancer Neg Hx    Liver cancer Neg Hx    Pancreatic cancer Neg Hx    Prostate cancer Neg Hx     Social History: Social History   Tobacco Use   Smoking status: Never    Smokeless tobacco: Current    Types: Snuff  Vaping Use   Vaping status: Never Used  Substance Use Topics   Alcohol use: Yes    Comment: Occasional Drink   Drug use: No   Social History   Social History Narrative   02/09/20   From[place]   Living: with wife [name] (2000)   Work: Research officer, [employer] - night shift      Family: good relationship with mom, lives nearby      Enjoys: golf, watch movies, walking      Exercise: job is labor intensive, walking one time a week, occasional work-out videos   Diet: bad on the weekends, does good during the week      Safety   Seat belts: Yes    Guns: No   Safe in relationships: Yes  Are you right handed or left handed? Right Handed   Are you currently employed ? Yes   What is your current occupation? [place] [employer]    Do you live at home alone? No    Who lives with you? Wife    What type of home do you live in: 1 story or 2 story? Lives in a one story home        Vital Signs:  BP (!) 130/95   Pulse 92   Ht 5\' 10"  (1.778 m)   Wt 193 lb (87.5 kg)   SpO2 99%   BMI 27.69 kg/m     Neurological Exam: MENTAL STATUS including orientation to time, place, person, recent and remote memory, attention span and concentration, language, and fund of knowledge is fair.  He is able to answer basic questions and simple commands, however, he does require repetitive of instruction at times.   Speech is not dysarthric. Blunted affect.  There is no psychomotor agitation on my exam.      12/24/2023   11:00 AM  Montreal Cognitive Assessment   Visuospatial/ Executive (0/5) 3  Naming (0/3) 3  Attention: Read list of digits (0/2) 1  Attention: Read list of letters (0/1) 1  Attention: Serial 7 subtraction starting at 100 (0/3) 2  Language: Repeat phrase (0/2) 1  Language : Fluency (0/1) 0  Abstraction (0/2) 2  Delayed Recall (0/5) 0  Orientation (0/6) 5  Total 18    CRANIAL NERVES: II:  No visual field defects.     III-IV-VI:  Pupils equal round and reactive to light.  Normal conjugate, extra-ocular eye movements in all directions of gaze.  No nystagmus.  No ptosis.   V:  Normal facial sensation.    VII:  Normal facial symmetry and movements.  Palmomental reflex present bilaterally.  Myerson's sign and snout is absent.  VIII:  Normal hearing and vestibular function.   IX-X:  Normal palatal movement.   XI:  Normal shoulder shrug and head rotation.   XII:  Normal tongue strength and range of motion, no deviation or fasciculation.  MOTOR:  No atrophy, fasciculations or abnormal movements.  No pronator drift.   Upper Extremity:  Right  Left  Deltoid  5/5   5/5   Biceps  5/5   5/5   Triceps  5/5   5/5   Wrist extensors  5/5   5/5   Wrist flexors  5/5   5/5   Finger extensors  5/5   5/5   Finger flexors  5/5   5/5   Dorsal interossei  5/5   5/5   Abductor pollicis  5/5   5/5   Tone (Ashworth scale)  0  0   Lower Extremity:  Right  Left  Hip flexors  5/5   5/5   Knee flexors  5/5   5/5   Knee extensors  5/5   5/5   Dorsiflexors  5/5   5/5   Plantarflexors  5/5   5/5   Toe extensors  5/5   5/5   Toe flexors  5/5   5/5   Tone (Ashworth scale)  0  0   MSRs:                                           Right        Left brachioradialis  2+  2+  biceps 2+  2+  triceps 2+  2+  patellar 3+  3+  ankle jerk 2+  2+  Hoffman no  no  plantar response down  down   SENSORY:  Normal and symmetric perception of light touch, pinprick, vibration, and temperature.     COORDINATION/GAIT: Normal finger-to- nose-finger.  Inconsistent finger tapping.  Estasia abasia gait with forward lurching.  Total time spent reviewing records, interview, history/exam, documentation, and coordination of care on day of encounter:  65 min    Thank you for allowing me to participate in patient's care.  If I can answer any additional questions, I would be pleased to do so.    Sincerely,    Mane Consolo K. Allena Katz, DO

## 2023-12-24 NOTE — Telephone Encounter (Signed)
[facility] called and LM. She didn't leave her name. Patient went to [facility] to get labs and he has [facility] forms. They need a call back  [phone]

## 2023-12-25 ENCOUNTER — Other Ambulatory Visit: Payer: Self-pay

## 2023-12-25 DIAGNOSIS — R413 Other amnesia: Secondary | ICD-10-CM

## 2023-12-25 DIAGNOSIS — R4189 Other symptoms and signs involving cognitive functions and awareness: Secondary | ICD-10-CM

## 2023-12-25 DIAGNOSIS — R292 Abnormal reflex: Secondary | ICD-10-CM

## 2023-12-25 NOTE — Telephone Encounter (Signed)
Called [facility] and they informed me that two orders TPO and Heavy Metals needs to be put in for [facility] as it was not coming up for them. Noticed that TPO is in as a active order so will place heavy metals.  Orders have been updated and placed.

## 2023-12-26 ENCOUNTER — Encounter: Payer: Self-pay | Admitting: Neurology

## 2023-12-26 ENCOUNTER — Ambulatory Visit
Admission: RE | Admit: 2023-12-26 | Discharge: 2023-12-26 | Disposition: A | Discharge status: Home / Self Care | Payer: 59 | Source: Ambulatory Visit | Attending: Neurology | Admitting: Neurology

## 2023-12-26 DIAGNOSIS — R4189 Other symptoms and signs involving cognitive functions and awareness: Secondary | ICD-10-CM

## 2023-12-26 DIAGNOSIS — R292 Abnormal reflex: Secondary | ICD-10-CM

## 2023-12-26 DIAGNOSIS — R413 Other amnesia: Secondary | ICD-10-CM

## 2023-12-29 LAB — HEAVY METALS, BLOOD
Arsenic: 1 ug/L (ref 0–9)
Lead, Blood: <1 ug/dL (ref 0.0–3.4)
Mercury: <1 ug/L (ref 0.0–14.9)

## 2024-01-01 LAB — HIV ANTIBODY (ROUTINE TESTING W REFLEX): HIV Screen 4th Generation wRfx: NONREACTIVE

## 2024-01-01 LAB — B12 AND FOLATE PANEL
Folate: >20 ng/mL (ref >3.0)
Vitamin B-12: >2000 pg/mL — ABNORMAL HIGH (ref 232–1245)

## 2024-01-01 LAB — RPR: RPR Ser Ql: NONREACTIVE

## 2024-01-01 LAB — ANA: Anti Nuclear Antibody (ANA): NEGATIVE

## 2024-01-01 LAB — SEDIMENTATION RATE: Sed Rate: 2 mm/h (ref 0–30)

## 2024-01-01 LAB — VITAMIN B1: Thiamine: 92 nmol/L (ref 66.5–200.0)

## 2024-01-01 LAB — C-REACTIVE PROTEIN: CRP: <1 mg/L (ref 0–10)

## 2024-01-01 LAB — ANTI-TPO AB (RDL)

## 2024-01-08 MED ORDER — GADOPICLENOL 0.5 MMOL/ML IV SOLN
8.5000 mL | Freq: Once | INTRAVENOUS | Status: AC | PRN
  Administered 2023-12-26: 8.5 mL via INTRAVENOUS

## 2024-01-08 NOTE — Telephone Encounter (Signed)
 [employer] Reading Room. They will get MRI Brain escalated to be read ASAP.

## 2024-02-20 ENCOUNTER — Other Ambulatory Visit: Payer: Self-pay | Admitting: Otolaryngology

## 2024-02-20 DIAGNOSIS — H93A2 Pulsatile tinnitus, left ear: Secondary | ICD-10-CM

## 2024-02-25 ENCOUNTER — Ambulatory Visit
Admission: RE | Admit: 2024-02-25 | Discharge: 2024-02-25 | Discharge status: Home / Self Care | Source: Ambulatory Visit | Attending: Otolaryngology | Admitting: Otolaryngology

## 2024-02-25 ENCOUNTER — Other Ambulatory Visit

## 2024-02-25 DIAGNOSIS — H93A2 Pulsatile tinnitus, left ear: Secondary | ICD-10-CM

## 2024-04-09 NOTE — Therapy (Signed)
 OUTPATIENT PHYSICAL THERAPY VESTIBULAR EVALUATION     Patient Name: [name] MRN: [id number] DOB:Sep 01, 1966, 58 y.o., male Today's Date: 04/13/2024  END OF SESSION:  PT End of Session - 04/13/24 1109     Visit Number 1    Number of Visits 9    Date for PT Re-Evaluation 05/11/24    Authorization Type [employer]    Authorization - Number of Visits 40   combined   PT Start Time 1018    PT Stop Time 1058    PT Time Calculation (min) 40 min    Activity Tolerance Patient tolerated treatment well    Behavior During Therapy WFL for tasks assessed/performed             Past Medical History:  Diagnosis Date   ADHD (attention deficit hyperactivity disorder)    Allergy    Anxiety    Depression    GERD (gastroesophageal reflux disease)    Hyperlipidemia    Past Surgical History:  Procedure Laterality Date   CARPAL TUNNEL RELEASE  12/2018   CHOLESTEATOMA EXCISION  1993   left   COLONOSCOPY  2019   NASAL SINUS SURGERY  2014   limited FESS, septoplasty, inferior turbinate reduction ([facility])   NASAL SINUS SURGERY  08/2021   septoplasty, inf turbinate resection [place] [facility] at [facility])   SPINE SURGERY  1999   Lumbar    VARICOCELE EXCISION  12/2005   Patient Active Problem List   Diagnosis Date Noted   Severe episode of recurrent major depressive disorder, with psychotic features (HCC) 11/19/2023   Snoring 07/19/2021   Abnormal EKG 07/19/2021   Acute non-recurrent sinusitis 04/12/2020   Mood swings 02/09/2020   Chewing tobacco use 02/09/2020   Seasonal allergies 02/09/2020   Left sided abdominal pain 05/11/2019   Vitamin D  deficiency 05/11/2019   B12 deficiency 04/28/2018   Numbness and tingling in right hand 04/28/2018   GERD (gastroesophageal reflux disease) 11/24/2014   Essential hypertension 01/22/2013   Depression, recurrent (HCC) 01/19/2011   ATAXIA, CEREBELLAR 01/04/2011   Headache 01/04/2011   Allergic rhinitis 07/03/2007   HLD (hyperlipidemia) 06/19/2007   Anxiety  state 05/12/2007    PCP: [name], MD  REFERRING PROVIDER: Rogers Clayman, MD  REFERRING DIAG: 47 (ICD-10-CM) - Dizziness and giddiness  THERAPY DIAG:  Dizziness and giddiness  Unsteadiness on feet  ONSET DATE: Nov/Dec 2024   Rationale for Evaluation and Treatment: Rehabilitation  SUBJECTIVE:   SUBJECTIVE STATEMENT:  Patient reports that 1st onset of dizziness occurred around [facility] 2024. Reports that he had a "breakdown of my mental heath" around the time of onset. Reports "blurred vision for a while" and this is resolved. Reports 60% hearing loss in the L ear s/p surgery several years ago- cannot recall if he had dizziness after this. Reports "movements" in his arms and legs remaining from medication side effects but he is not able to describe this further.  Pt reports running into things and losing his balance but this is improving. Reports falling off a piece of equipment at work- denies head trauma but bruised the R ribs. Reports dizziness towards the end of the day when he winds down and reports that he had a vestibular assessment at the ENT. Reports roaring in the L ear and hears pulsing in the ear when he lies down. Denies head trauma, infection/illness, vision changes/double vision, new hearing loss, migraines.     Pt accompanied by: self  PERTINENT HISTORY: ADHD, anxiety, depression, GERD, HLD, lumbar surgery 1999,  PAIN:  Are you having pain? No  PRECAUTIONS: Fall  RED FLAGS: None   WEIGHT BEARING RESTRICTIONS: No  FALLS: Has patient fallen in last 6 months? Yes. Number of falls 1  LIVING ENVIRONMENT: Lives with: lives with their spouse Lives in: House/apartment Stairs: 2 steps to enter; 1 story home Has following equipment at home: None  PLOF: Independent and Vocation/Vocational requirements: full time working for [employer]- working with heavy machinery, lifting, walking  PATIENT GOALS: improve dizziness   OBJECTIVE:  Note:  Objective measures were completed at Evaluation unless otherwise noted.  DIAGNOSTIC FINDINGS: 02/25/24 MR angio head: Patent intracranial arterial vasculature. No evidence of occlusion, high-grade stenosis, or aneurysm  12/26/23 brain MRI: Unremarkable appearance of the brain.  11/05/23 head CT:  No acute intracranial abnormality. 2. Mild thinning of the sigmoid plate on the left with postoperative changes of prior mastoidectomy.  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    COGNITION: Overall cognitive status: Within functional limits for tasks assessed   SENSATION: Pt denies N/T in UEs/LEs   POSTURE:  WNL  GAIT: Gait pattern: reduced B foot clearance  Assistive device utilized: None Level of assistance: Complete Independence  PATIENT SURVEYS:  DHI 30/100   VESTIBULAR ASSESSMENT   GENERAL OBSERVATION: pt wears readers. Tendency to turn head L with several of today's tests   OCULOMOTOR EXAM: Ocular Alignment: Cover/Uncover Test: WNL Cover/Cross Cover Test: WNL   Ocular ROM: No Limitations   Spontaneous Nystagmus: absent   Gaze-Induced Nystagmus: absent   Smooth Pursuits: intact   Saccades: intact   Convergence/Divergence: 2 cm ;appearing to use R eye only but suppression check was normal   VESTIBULAR - OCULAR REFLEX:    Slow VOR: Normal vertical and horizontal; avoids R head turn. Reports pulsing in L ear   VOR Cancellation: Normal   Head-Impulse Test: HIT Right: positive HIT Left: positive *c/o pulsing in the ears and c/o mild dizziness      AUDITORY SCREEN:  Rinne Test: Positive on L with hearing aid removed     POSITIONAL TESTING:  *c/o "pulsating" in the ears throughout testing   Right Roll Test:  negative Left Roll Test: negative; c/o mild dizziness   Right Sidelying: negative; asymptomatic; c/o Left Sidelying: negative; c/o mild dizziness upon lying down and sitting up                                                                                                                                TREATMENT DATE: 04/13/24  PATIENT EDUCATION: Education details: prognosis, POC, exam findings, encouraged keeping a log of when sx come on; answered pt's questions on tinnitus and explained that this is not a sx that  usually responds to PT Person educated: Patient Education method: Explanation Education comprehension: verbalized understanding  HOME EXERCISE PROGRAM:   GOALS: Goals reviewed with patient? Yes  SHORT TERM GOALS: Target date: 04/27/2024  Patient to be independent with initial HEP. Baseline: HEP initiated Goal status: INITIAL    LONG TERM GOALS: Target date: 05/11/2024  Patient to be independent with advanced HEP. Baseline: Not yet initiated  Goal status: INITIAL  Patient to report 75% improvement in dizziness.  Baseline: - Goal status: INITIAL  Patient will report 0/10 dizziness with bed mobility.  Baseline: Symptomatic  Goal status: INITIAL  Patient to demonstrate mild sway with M-CTSIB condition with eyes closed/foam surface in order to improve safety in environments with uneven surfaces and dim lighting. Baseline: NT Goal status: INITIAL  Patient to score at least 25/30 on FGA in order to decrease risk of falls. Baseline: NT Goal status: INITIAL  Patient to score at least 18 points less on DHI in order to meet MCID and improve functional outcomes.  Baseline: 30 Goal status: INITIAL   ASSESSMENT:  CLINICAL IMPRESSION:   Patient is a 59 y/o M presenting to OPPT with c/o dizziness for the past 6+ months. Denies head trauma, infection/illness, vision changes/double vision, new hearing loss, migraines. Also reports pulsatile tinnitus, imbalance.  Patient today presenting with positive B HIT, motion sensitivity with L movements, and testing suggesting L sensorineural hearing loss (this is known and pt wears L hearing aid). No central findings today. Would benefit from skilled PT services 1-2x/week for 4 weeks to address aforementioned impairments in order to optimize level of function.    OBJECTIVE IMPAIRMENTS: decreased balance and dizziness.   ACTIVITY LIMITATIONS: carrying, lifting, bending, standing, squatting, stairs, bed mobility, and dressing  PARTICIPATION LIMITATIONS: cleaning, laundry, driving, shopping, community activity, occupation, and yard work  PERSONAL FACTORS: Age, Past/current experiences, Time since onset of injury/illness/exacerbation, and 3+ comorbidities: ADHD, anxiety, depression, GERD, HLD, lumbar surgery 1999, L mastoid surgery are also affecting patient's functional outcome.   REHAB POTENTIAL: Good  CLINICAL DECISION MAKING: Evolving/moderate complexity  EVALUATION COMPLEXITY: Moderate   PLAN:  PT FREQUENCY: 1-2x/week  PT DURATION: 4 weeks  PLANNED INTERVENTIONS: 97164- PT Re-evaluation, 97750- Physical Performance Testing, 97110-Therapeutic exercises, 97530- Therapeutic activity, 97112- Neuromuscular re-education, 97535- Self Care, 16109- Manual therapy, [phone]- Gait training, [phone]- Canalith repositioning, [phone] (1-2 muscles), 20561 (3+ muscles)- Dry Needling, Patient/Family education, Balance training, Stair training, Taping, Joint mobilization, Spinal mobilization, Vestibular training, Cryotherapy, and Moist heat  PLAN FOR NEXT SESSION: FGA, MSQ, MCTSIB; initiate HEP with habituation and VOR training; discuss pt's exercise regimen to further tease out aggravating factors    Thaddeus Filippo, PT, DPT 04/13/24 12:09 PM  [facility] [address] [place], [place] [address] Phone # [phone] Fax # [phone]

## 2024-04-13 ENCOUNTER — Other Ambulatory Visit: Payer: Self-pay

## 2024-04-13 ENCOUNTER — Encounter: Payer: Self-pay | Admitting: Physical Therapy

## 2024-04-13 ENCOUNTER — Ambulatory Visit: Attending: Otolaryngology | Admitting: Physical Therapy

## 2024-04-13 DIAGNOSIS — R42 Dizziness and giddiness: Secondary | ICD-10-CM | POA: Diagnosis present

## 2024-04-13 DIAGNOSIS — R2681 Unsteadiness on feet: Secondary | ICD-10-CM | POA: Insufficient documentation

## 2024-04-18 ENCOUNTER — Ambulatory Visit
Admission: RE | Admit: 2024-04-18 | Discharge: 2024-04-18 | Disposition: A | Discharge status: Home / Self Care | Source: Ambulatory Visit | Attending: Physician Assistant | Admitting: Physician Assistant

## 2024-04-18 ENCOUNTER — Other Ambulatory Visit: Payer: Self-pay

## 2024-04-18 VITALS — BP 176/99 | HR 98 | Temp 98.2°F | Resp 16 | Ht 70.0 in | Wt 195.0 lb

## 2024-04-18 DIAGNOSIS — I10 Essential (primary) hypertension: Secondary | ICD-10-CM

## 2024-04-18 DIAGNOSIS — I160 Hypertensive urgency: Secondary | ICD-10-CM

## 2024-04-18 MED ORDER — LISINOPRIL 20 MG PO TABS: 20.0000 mg | ORAL_TABLET | Freq: Every day | ORAL | 0 refills | Status: AC

## 2024-04-18 NOTE — ED Triage Notes (Signed)
 Pt presents with complaints of high blood pressure readings x couple of weeks. BP reading in clinic today is 191/112. Pt states he is currently experiencing roaring, pulsating, and swishing in the ears. Currently not on medications for blood pressure. Denies SOB and chest pain. Increased anxiety. Currently denies pain.

## 2024-04-18 NOTE — ED Provider Notes (Signed)
 [facility]    CSN: [id number] Arrival date & time: 04/18/24  0945      History   Chief Complaint Chief Complaint  Patient presents with   Hypertension    My Bp readings have been high - Entered by patient    HPI [name] is a 58 y.o. male.   HPI  Pt reports concerns for elevated BP  He reports that he has been having ringing and pulsating sensation in his ears  He states he was on Lisinopril  about 2 years ago for HTN but denies previous hx of HTN  He also admits to hx of anxiety which he thinks is a contributing factor  He has not had Lisinopril  since 2023  He has a PCP but has not been there in almost 2 years   He would like to resume Lisinopril  as he did not have any bad reactions to this     Past Medical History:  Diagnosis Date   ADHD (attention deficit hyperactivity disorder)    Allergy    Anxiety    Depression    GERD (gastroesophageal reflux disease)    Hyperlipidemia     Patient Active Problem List   Diagnosis Date Noted   Severe episode of recurrent major depressive disorder, with psychotic features (HCC) 11/19/2023   Snoring 07/19/2021   Abnormal EKG 07/19/2021   Acute non-recurrent sinusitis 04/12/2020   Mood swings 02/09/2020   Chewing tobacco use 02/09/2020   Seasonal allergies 02/09/2020   Left sided abdominal pain 05/11/2019   Vitamin D  deficiency 05/11/2019   B12 deficiency 04/28/2018   Numbness and tingling in right hand 04/28/2018   GERD (gastroesophageal reflux disease) 11/24/2014   Essential hypertension 01/22/2013   Depression, recurrent (HCC) 01/19/2011   ATAXIA, CEREBELLAR 01/04/2011   Headache 01/04/2011   Allergic rhinitis 07/03/2007   HLD (hyperlipidemia) 06/19/2007   Anxiety state 05/12/2007    Past Surgical History:  Procedure Laterality Date   CARPAL TUNNEL RELEASE  12/2018   CHOLESTEATOMA EXCISION  1993   left   COLONOSCOPY  2019   NASAL SINUS SURGERY  2014   limited FESS, septoplasty, inferior  turbinate reduction ([facility])   NASAL SINUS SURGERY  08/2021   septoplasty, inf turbinate resection [facility] at [facility])   SPINE SURGERY  1999   Lumbar    VARICOCELE EXCISION  12/2005       Home Medications    Prior to Admission medications   Medication Sig Start Date End Date Taking? Authorizing Provider  lisinopril  (ZESTRIL ) 20 MG tablet Take 1 tablet (20 mg total) by mouth daily. 04/18/24  Yes Steph Cheadle E, PA-C  ALPRAZolam (XANAX) 0.5 MG tablet Take 0.25-0.5 mg by mouth every 6 (six) hours as needed. 12/11/23   [provider]  Ascorbic Acid (VITAMIN C PO) Take by mouth.    [provider]  Cholecalciferol (VITAMIN D3 ADULT GUMMIES PO) Take 3 tablets by mouth daily. Gummies    [provider]  clonazePAM (KLONOPIN) 0.5 MG disintegrating tablet Take 0.5 mg by mouth every 6 (six) hours as needed. One tablet at bedtime Patient not taking: Reported on 04/13/2024 11/29/23   [provider]  Cyanocobalamin (VITAMIN B 12 PO) Take 3 tablets by mouth daily. Gummies    [provider]  lamoTRIgine  (LAMICTAL ) 100 MG tablet Take 2 tablets (200 mg) in the morning and then 1/2 tablet (50 mg) at bedtime Patient taking differently: Take 1 1/2 tablets in the morning and then 1/2 tablet  at bedtime 02/20/21   [name], MD  lisinopril  (ZESTRIL ) 20 MG tablet TAKE 1 TABLET BY MOUTH EVERY DAY 11/15/21   [name], MD  loratadine (CLARITIN) 10 MG tablet Take 10 mg by mouth daily. Patient not taking: Reported on 04/13/2024    [provider]  omeprazole  (PRILOSEC) 40 MG capsule Take 1 capsule (40 mg total) by mouth 2 (two) times daily. 02/20/21   [name], MD  Probiotic Product (PROBIOTIC-10 PO) Take by mouth.    [provider]  rosuvastatin  (CRESTOR ) 40 MG tablet Take 1 tablet (40 mg total) by mouth daily. 02/20/21   [name], MD  Triamcinolone Acetonide (NASACORT ALLERGY 24HR NA) Place into the nose.    [provider]  Vilazodone   HCl (VIIBRYD ) 40 MG TABS TAKE 1 TABLET BY MOUTH EVERY DAY IN THE MORNING 02/20/21   [name], MD  VITAMIN D  PO Take by mouth.    [provider]    Family History Family History  Problem Relation Age of Onset   Hyperlipidemia Mother    Heart attack Mother 7   Hypertension Brother    Colon cancer Neg Hx    Colon polyps Neg Hx    Esophageal cancer Neg Hx    Stomach cancer Neg Hx    Rectal cancer Neg Hx    Liver cancer Neg Hx    Pancreatic cancer Neg Hx    Prostate cancer Neg Hx     Social History Social History   Tobacco Use   Smoking status: Never   Smokeless tobacco: Current    Types: Snuff  Vaping Use   Vaping status: Never Used  Substance Use Topics   Alcohol use: Yes    Comment: Occasional Drink   Drug use: No     Allergies   Bupropion hcl and Fluoxetine hcl   Review of Systems Review of Systems  Eyes:  Negative for visual disturbance.  Respiratory:  Negative for shortness of breath and wheezing.   Cardiovascular:  Negative for chest pain, palpitations and leg swelling.  Neurological:  Positive for light-headedness. Negative for headaches.  Psychiatric/Behavioral:  Positive for sleep disturbance.      Physical Exam Triage Vital Signs ED Triage Vitals  Encounter Vitals Group     BP 04/18/24 0957 (!) 191/112     Girls Systolic BP Percentile --      Girls Diastolic BP Percentile --      Boys Systolic BP Percentile --      Boys Diastolic BP Percentile --      Pulse Rate 04/18/24 0957 98     Resp 04/18/24 0957 16     Temp 04/18/24 0957 98.2 F (36.8 C)     Temp Source 04/18/24 0957 Oral     SpO2 04/18/24 0957 98 %     Weight 04/18/24 0957 195 lb (88.5 kg)     Height 04/18/24 0957 5' 10 (1.778 m)     Head Circumference --      Peak Flow --      Pain Score 04/18/24 1024 0     Pain Loc --      Pain Education --      Exclude from Growth Chart --    No data found.  Updated Vital Signs BP (!) 176/99 (BP Location: Right Arm)   Pulse  98   Temp 98.2 F (36.8 C) (Oral)   Resp 16   Ht 5' 10 (1.778 m)   Wt 195 lb (88.5  kg)   SpO2 98%   BMI 27.98 kg/m   Visual Acuity Right Eye Distance:   Left Eye Distance:   Bilateral Distance:    Right Eye Near:   Left Eye Near:    Bilateral Near:     Physical Exam Vitals reviewed.  Constitutional:      General: He is awake.     Appearance: Normal appearance. He is well-developed and well-groomed.  HENT:     Head: Normocephalic and atraumatic.   Cardiovascular:     Rate and Rhythm: Normal rate and regular rhythm.     Pulses: Normal pulses.          Radial pulses are 2+ on the right side and 2+ on the left side.     Heart sounds: Normal heart sounds. No murmur heard.    No friction rub. No gallop.  Pulmonary:     Effort: Pulmonary effort is normal.     Breath sounds: Normal breath sounds. No decreased air movement. No decreased breath sounds, wheezing, rhonchi or rales.   Musculoskeletal:     Cervical back: Normal range of motion.     Right lower leg: No edema.     Left lower leg: No edema.   Neurological:     General: No focal deficit present.     Mental Status: He is alert and oriented to person, place, and time. Mental status is at baseline.     GCS: GCS eye subscore is 4. GCS verbal subscore is 5. GCS motor subscore is 6.   Psychiatric:        Attention and Perception: Attention and perception normal.        Mood and Affect: Mood and affect normal.        Speech: Speech normal.        Behavior: Behavior normal. Behavior is cooperative.        Thought Content: Thought content normal.        Cognition and Memory: Cognition normal.      UC Treatments / Results  Labs (all labs ordered are listed, but only abnormal results are displayed) Labs Reviewed - No data to display  EKG   Radiology No results found.  Procedures Procedures (including critical care time)  Medications Ordered in UC Medications - No data to display  Initial Impression /  Assessment and Plan / UC Course  I have reviewed the triage vital signs and the nursing notes.  Pertinent labs & imaging results that were available during my care of the patient were reviewed by me and considered in my medical decision making (see chart for details).      Final Clinical Impressions(s) / UC Diagnoses   Final diagnoses:  Primary hypertension  Hypertensive urgency   Patient presents today with concerns for elevated blood pressure readings that has been ongoing for a few weeks.  He also reports feeling a pulsating and rushing sensation in the ears. Vitals are concerning for elevated BP today and he has a hx of taking Lisinopril  (presumably for HTN although pt denies previous hx of this condition). Given lack of red flag symptoms signaling HTN emergency, will send in script for Lisinopril  20 mg PO every day and reviewed importance of follow up with PCP for ongoing medication management. Reviewed ED and return precautions. Recommend that he monitors BP at home and brings BP records to PCP office for next apt to check trends and assist with management plan. Follow up as indicated  Discharge Instructions      Your blood pressure was elevated today.  If possible please take it at home using an electronic blood pressure cuff for the upper arm Record your blood pressure once per day and bring them  with you to your apt so your PCP  can review the results and discuss management strategies with you.  We have started you on a medication called Lisinopril  20 mg to be taken by mouth once per day.  It is important that you take this medication consistently as this will help control your blood pressure.  Please make sure that you schedule follow-up with your primary care provider for ongoing medication management and refills. If at any point you start to develop elevated blood pressure with headaches, dizziness, vision changes, chest pain, difficulty breathing please go to the emergency  room as these could be signs of hypertensive emergency.     ED Prescriptions     Medication Sig Dispense Auth. Provider   lisinopril  (ZESTRIL ) 20 MG tablet Take 1 tablet (20 mg total) by mouth daily. 30 tablet Brittania Sudbeck E, PA-C      PDMP not reviewed this encounter.   Jerona Mooring, PA-C 04/18/24 1551

## 2024-04-18 NOTE — Discharge Instructions (Addendum)
 Your blood pressure was elevated today.  If possible please take it at home using an electronic blood pressure cuff for the upper arm Record your blood pressure once per day and bring them  with you to your apt so your PCP  can review the results and discuss management strategies with you.  We have started you on a medication called Lisinopril  20 mg to be taken by mouth once per day.  It is important that you take this medication consistently as this will help control your blood pressure.  Please make sure that you schedule follow-up with your primary care provider for ongoing medication management and refills. If at any point you start to develop elevated blood pressure with headaches, dizziness, vision changes, chest pain, difficulty breathing please go to the emergency room as these could be signs of hypertensive emergency.

## 2024-04-20 ENCOUNTER — Ambulatory Visit

## 2024-04-20 DIAGNOSIS — R2681 Unsteadiness on feet: Secondary | ICD-10-CM

## 2024-04-20 DIAGNOSIS — R42 Dizziness and giddiness: Secondary | ICD-10-CM

## 2024-04-20 NOTE — Therapy (Signed)
 OUTPATIENT PHYSICAL THERAPY VESTIBULAR TREATMENT     Patient Name: [name] MRN: [id number] DOB:09-May-1966, 58 y.o., male Today's Date: 04/20/2024  END OF SESSION:  PT End of Session - 04/20/24 1614     Visit Number 2    Number of Visits 9    Date for PT Re-Evaluation 05/11/24    Authorization Type [employer]    Authorization - Number of Visits 40   combined   PT Start Time 1615    PT Stop Time 1700    PT Time Calculation (min) 45 min    Activity Tolerance Patient tolerated treatment well    Behavior During Therapy WFL for tasks assessed/performed          Past Medical History:  Diagnosis Date   ADHD (attention deficit hyperactivity disorder)    Allergy    Anxiety    Depression    GERD (gastroesophageal reflux disease)    Hyperlipidemia    Past Surgical History:  Procedure Laterality Date   CARPAL TUNNEL RELEASE  12/2018   CHOLESTEATOMA EXCISION  1993   left   COLONOSCOPY  2019   NASAL SINUS SURGERY  2014   limited FESS, septoplasty, inferior turbinate reduction ([facility])   NASAL SINUS SURGERY  08/2021   septoplasty, inf turbinate resection [place] [facility] at [facility])   SPINE SURGERY  1999   Lumbar    VARICOCELE EXCISION  12/2005   Patient Active Problem List   Diagnosis Date Noted   Severe episode of recurrent major depressive disorder, with psychotic features (HCC) 11/19/2023   Snoring 07/19/2021   Abnormal EKG 07/19/2021   Acute non-recurrent sinusitis 04/12/2020   Mood swings 02/09/2020   Chewing tobacco use 02/09/2020   Seasonal allergies 02/09/2020   Left sided abdominal pain 05/11/2019   Vitamin D  deficiency 05/11/2019   B12 deficiency 04/28/2018   Numbness and tingling in right hand 04/28/2018   GERD (gastroesophageal reflux disease) 11/24/2014   Essential hypertension 01/22/2013   Depression, recurrent (HCC) 01/19/2011   ATAXIA, CEREBELLAR 01/04/2011   Headache 01/04/2011   Allergic rhinitis 07/03/2007   HLD (hyperlipidemia) 06/19/2007   Anxiety state  05/12/2007    PCP: [name], MD  REFERRING PROVIDER: Rogers Clayman, MD  REFERRING DIAG: 78 (ICD-10-CM) - Dizziness and giddiness  THERAPY DIAG:  Dizziness and giddiness  Unsteadiness on feet  ONSET DATE: Nov/Dec 2024   Rationale for Evaluation and Treatment: Rehabilitation  SUBJECTIVE:   SUBJECTIVE STATEMENT:  Still having symptoms with certain movements, noting some buckling of right knee when walking.  Ongoing roaring in his left ear primarily. Dizzy at times with driving  From evaluation:  Patient reports that 1st onset of dizziness occurred around November/December 2024. Reports that he had a breakdown of my mental heath around the time of onset. Reports blurred vision for a while and this is resolved. Reports 60% hearing loss in the L ear s/p surgery several years ago- cannot recall if he had dizziness after this. Reports "movements" in his arms and legs remaining from medication side effects but he is not able to describe this further.  Pt reports running into things and losing his balance but this is improving. Reports falling off a piece of equipment at work- denies head trauma but bruised the R ribs. Reports dizziness towards the end of the day when he winds down and reports that he had a vestibular assessment at the ENT. Reports roaring in the L ear and hears pulsing in the ear when he lies down. Denies head  trauma, infection/illness, vision changes/double vision, new hearing loss, migraines.     Pt accompanied by: self  PERTINENT HISTORY: ADHD, anxiety, depression, GERD, HLD, lumbar surgery 1999,   PAIN:  Are you having pain? No  PRECAUTIONS: Fall  RED FLAGS: None   WEIGHT BEARING RESTRICTIONS: No  FALLS: Has patient fallen in last 6 months? Yes. 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    Change in Gait Speed Able to smoothly change walking speed without loss of balance or gait deviation. Deviate no more than 6 in outside of the 12 in walkway width.    Gait with Horizontal Head Turns Performs head turns smoothly with slight change in gait velocity (eg, minor disruption to smooth gait path), deviates 6-10 in outside 12 in walkway width, or uses an assistive device.    Gait with Vertical Head Turns Performs head turns with no change in gait. Deviates no more than 6 in outside 12 in walkway width.    Gait and Pivot Turn Pivot turns safely within 3 sec and stops quickly with no loss of balance.    Step Over Obstacle Is able to step over 2 stacked shoe boxes taped together (9 in total height) without changing gait speed. No evidence of imbalance.    Gait with Narrow Base of Support Is able to ambulate for 10 steps heel to toe with no staggering.    Gait with Eyes Closed Walks 20 ft, uses assistive device, slower speed, mild gait deviations, deviates 6-10 in outside 12 in walkway width. Ambulates 20 ft in less than 9 sec but greater than 7 sec.    Ambulating Backwards Walks 20 ft, no assistive devices, good speed, no evidence for imbalance, normal gait    Steps Alternating feet, no rail.    Total Score 27          M-CTSIB  Condition 1: Firm Surface, EO 30 Sec, Normal Sway  Condition 2:  Firm Surface, EC 5 Sec, retro_LOB Sway  Condition 3: Foam Surface, EO 14 Sec, Moderate Sway  Condition 4: Foam Surface, EC 8 Sec, Severe Sway      Note: Objective measures were completed at Evaluation unless otherwise noted.  DIAGNOSTIC FINDINGS: 02/25/24 MR angio head: Patent intracranial arterial vasculature. No evidence of occlusion, high-grade stenosis, or aneurysm  12/26/23 brain MRI: Unremarkable appearance of the brain.  11/05/23 head CT:  No acute intracranial abnormality. 2. Mild thinning of the sigmoid plate on the left with postoperative changes of prior mastoidectomy.  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    COGNITION: Overall cognitive status: Within functional limits for tasks assessed   SENSATION: Pt denies N/T in UEs/LEs   POSTURE:  WNL  GAIT: Gait pattern: reduced B foot clearance  Assistive device utilized: None Level of assistance: Complete Independence  PATIENT SURVEYS:  DHI 30/100   VESTIBULAR ASSESSMENT   GENERAL OBSERVATION: pt wears readers. Tendency to turn head L with several of today's tests   OCULOMOTOR EXAM: Ocular Alignment: Cover/Uncover Test: WNL Cover/Cross Cover Test: WNL   Ocular ROM: No Limitations   Spontaneous Nystagmus: absent   Gaze-Induced Nystagmus: absent   Smooth Pursuits: intact   Saccades: intact   Convergence/Divergence: 2 cm ;appearing to use R eye only but suppression check was normal   VESTIBULAR - OCULAR REFLEX:    Slow VOR: Normal vertical and horizontal; avoids R head turn. Reports pulsing in L ear   VOR Cancellation: Normal   Head-Impulse Test: HIT Right: positive HIT Left: positive *c/o pulsing in the ears and c/o mild dizziness      AUDITORY  SCREEN:  Rinne Test: Positive on L with hearing aid removed     POSITIONAL TESTING:  *c/o pulsating in the ears throughout testing   Right Roll Test: negative Left Roll Test: negative; c/o mild dizziness   Right Sidelying: negative; asymptomatic; c/o Left Sidelying: negative; c/o mild dizziness upon lying down and sitting up                                                                                                                                TREATMENT DATE: 04/13/24  PATIENT EDUCATION: Education details: prognosis, POC, exam findings, encouraged keeping a log of when sx come on; answered pt's questions on tinnitus and explained that this is not a sx that usually responds to PT Person educated: Patient Education method: Explanation Education comprehension: verbalized understanding  HOME EXERCISE PROGRAM:   GOALS: Goals reviewed with patient? Yes  SHORT TERM GOALS: Target date: 04/27/2024  Patient to be independent with initial HEP. Baseline: HEP initiated Goal status: INITIAL    LONG TERM GOALS: Target date: 05/11/2024  Patient to be independent with advanced HEP. Baseline: Not yet initiated  Goal status: INITIAL  Patient to report 75% improvement in dizziness.  Baseline: - Goal status: INITIAL  Patient will report 0/10 dizziness with bed mobility.  Baseline: Symptomatic  Goal status: INITIAL  Patient to demonstrate mild sway with M-CTSIB condition with eyes closed/foam surface in order to improve safety in environments with uneven surfaces and dim lighting. Baseline: NT Goal status: INITIAL  Patient to score at least 25/30 on FGA in order to decrease risk of falls. Baseline: 27/30 Goal status: MET  Patient to score at least 18 points less on DHI in order to meet MCID and improve functional outcomes.  Baseline:  30 Goal status: INITIAL   ASSESSMENT:  CLINICAL IMPRESSION: Demo significant difficulty with eyes closed conditions M-CTSIB w/ LOB in  condition 2 and 4.  Functional Gait Assessment with good performance demo 27/30 indicating low risk for falls and no obvious compensatory strategies needed throughout.  Addressed static multisensory balance deficits with corner balance activities to improve tolerance/stability to these conditions and VOR x 1 in standing to improve gaze stabilization. Continued sessions to progress POC details to improve balance/mobility and reduce intensity of symptoms.   From evaluation: Patient is a 58 y/o M presenting to OPPT with c/o dizziness for the past 6+ months. Denies head trauma, infection/illness, vision changes/double vision, new hearing loss, migraines. Also reports pulsatile tinnitus, imbalance. Patient today presenting with positive B HIT, motion sensitivity with L movements, and testing suggesting L sensorineural hearing loss (this is known and pt wears L hearing aid). No central findings today. Would benefit from skilled PT services 1-2x/week for 4 weeks to address aforementioned impairments in order to optimize level of function.    OBJECTIVE IMPAIRMENTS: decreased balance and dizziness.   ACTIVITY LIMITATIONS: carrying, lifting, bending, standing, squatting, stairs, bed mobility, and dressing  PARTICIPATION LIMITATIONS: cleaning, laundry, driving, shopping, community activity, occupation, and yard work  PERSONAL FACTORS: Age, Past/current experiences, Time since onset of injury/illness/exacerbation, and 3+ comorbidities: ADHD, anxiety, depression, GERD, HLD, lumbar surgery 1999, L mastoid surgery are also affecting patient's functional outcome.   REHAB POTENTIAL: Good  CLINICAL DECISION MAKING: Evolving/moderate complexity  EVALUATION COMPLEXITY: Moderate   PLAN:  PT FREQUENCY: 1-2x/week  PT DURATION: 4 weeks  PLANNED INTERVENTIONS: 97164- PT Re-evaluation, 97750- Physical Performance Testing, 97110-Therapeutic exercises, 97530- Therapeutic activity, [id number]- Neuromuscular re-education,  97535- Self Care, 60454- Manual therapy, [id number]- Gait training, [phone]- Canalith repositioning, [phone] (1-2 muscles), 20561 (3+ muscles)- Dry Needling, Patient/Family education, Balance training, Stair training, Taping, Joint mobilization, Spinal mobilization, Vestibular training, Cryotherapy, and Moist heat  PLAN FOR NEXT SESSION: , MSQ,  discuss pt's exercise regimen to further tease out aggravating factors    5:06 PM, 04/20/24 M. Kelly Shlok Raz, PT, DPT Physical Therapist- [place] Office Number: [phone]

## 2024-04-22 ENCOUNTER — Ambulatory Visit

## 2024-04-22 DIAGNOSIS — R42 Dizziness and giddiness: Secondary | ICD-10-CM

## 2024-04-22 DIAGNOSIS — R2681 Unsteadiness on feet: Secondary | ICD-10-CM

## 2024-04-22 NOTE — Therapy (Signed)
 OUTPATIENT PHYSICAL THERAPY VESTIBULAR TREATMENT     Patient Name: [name] MRN: [id number] DOB:27-Jul-1966, 58 y.o., male Today's Date: 04/22/2024  END OF SESSION:  PT End of Session - 04/22/24 1559     Visit Number 3    Number of Visits 9    Date for PT Re-Evaluation 05/11/24    Authorization Type [employer]    Authorization - Number of Visits 40   combined   PT Start Time 1600    PT Stop Time 1645    PT Time Calculation (min) 45 min    Activity Tolerance Patient tolerated treatment well    Behavior During Therapy WFL for tasks assessed/performed          Past Medical History:  Diagnosis Date   ADHD (attention deficit hyperactivity disorder)    Allergy    Anxiety    Depression    GERD (gastroesophageal reflux disease)    Hyperlipidemia    Past Surgical History:  Procedure Laterality Date   CARPAL TUNNEL RELEASE  12/2018   CHOLESTEATOMA EXCISION  1993   left   COLONOSCOPY  2019   NASAL SINUS SURGERY  2014   limited FESS, septoplasty, inferior turbinate reduction ([facility])   NASAL SINUS SURGERY  08/2021   septoplasty, inf turbinate resection [facility] [facility])   SPINE SURGERY  1999   Lumbar    VARICOCELE EXCISION  12/2005   Patient Active Problem List   Diagnosis Date Noted   Severe episode of recurrent major depressive disorder, with psychotic features (HCC) 11/19/2023   Snoring 07/19/2021   Abnormal EKG 07/19/2021   Acute non-recurrent sinusitis 04/12/2020   Mood swings 02/09/2020   Chewing tobacco use 02/09/2020   Seasonal allergies 02/09/2020   Left sided abdominal pain 05/11/2019   Vitamin D  deficiency 05/11/2019   B12 deficiency 04/28/2018   Numbness and tingling in right hand 04/28/2018   GERD (gastroesophageal reflux disease) 11/24/2014   Essential hypertension 01/22/2013   Depression, recurrent (HCC) 01/19/2011   ATAXIA, CEREBELLAR 01/04/2011   Headache 01/04/2011   Allergic rhinitis 07/03/2007   HLD (hyperlipidemia) 06/19/2007   Anxiety state  05/12/2007    PCP: [name], MD  REFERRING PROVIDER: Rogers Clayman, MD  REFERRING DIAG: 71 (ICD-10-CM) - Dizziness and giddiness  THERAPY DIAG:  Dizziness and giddiness  Unsteadiness on feet  ONSET DATE: Nov/Dec 2024   Rationale for Evaluation and Treatment: Rehabilitation  SUBJECTIVE:   SUBJECTIVE STATEMENT:  Still having symptoms with certain movements, noting some buckling of right knee when walking.  Ongoing roaring in his left ear primarily. Dizzy at times with driving  From evaluation:  Patient reports that 1st onset of dizziness occurred around November/December 2024. Reports that he had a breakdown of my mental heath around the time of onset. Reports blurred vision for a while and this is resolved. Reports 60% hearing loss in the L ear s/p surgery several years ago- cannot recall if he had dizziness after this. Reports "movements" in his arms and legs remaining from medication side effects but he is not able to describe this further.  Pt reports running into things and losing his balance but this is improving. Reports falling off a piece of equipment at work- denies head trauma but bruised the R ribs. Reports dizziness towards the end of the day when he winds down and reports that he had a vestibular assessment at the ENT. Reports roaring in the L ear and hears pulsing in the ear when he lies down. Denies head  trauma, infection/illness, vision changes/double vision, new hearing loss, migraines.     Pt accompanied by: self  PERTINENT HISTORY: ADHD, anxiety, depression, GERD, HLD, lumbar surgery 1999,   PAIN:  Are you having pain? No  PRECAUTIONS: Fall  RED FLAGS: None   WEIGHT BEARING RESTRICTIONS: No  FALLS: Has patient fallen in last 6 months? Yes. Number of falls 1  LIVING ENVIRONMENT: Lives with: lives with their spouse Lives in: House/apartment Stairs: 2 steps to enter; 1 story home Has following equipment at home: None  PLOF: Independent and  Vocation/Vocational requirements: full time working for [employer]- working with heavy machinery, lifting, walking  PATIENT GOALS: improve dizziness   OBJECTIVE:   TODAY'S TREATMENT: 04/22/24 Activity Comments  HEP review -good stability to balance -VOR x 1: able to maintain 120 bpm. Vertical symptomatic > horizontal  Corner balance on foam   Brandt-Daroff x 3 reps Notices increased pulsing in ear but no dizziness/swimmyheaded  Rolling L/R Eyes open/closed--more symptomatic to left side  Walking VOR x 1 Requires decreased walk speed         TODAY'S TREATMENT: 04/20/24 Activity Comments  M-CTSIB See below  FGA 27/30  Corner balance see HEP below   VOR x 1 (standing)           HOME EXERCISE PROGRAM: Access Code: [email or web address] URL: [email or web address] Date: 04/22/2024 Prepared by: Tedd Favorite  Exercises - Corner Balance Feet Together With Eyes Closed  - 1 x daily - 7 x weekly - 3 sets - 30 sec hold - Corner Balance Feet Together: Eyes Open With Head Turns  - 1 x daily - 7 x weekly - 3 sets - 30 sec hold - Corner Balance Feet Together: Eyes Closed With Head Turns  - 1 x daily - 7 x weekly - 3 sets - 30 sec hold - Tandem Stance in Corner  - 1 x daily - 7 x weekly - 3 sets - 30 sec hold - Standing Gaze Stabilization with Head Rotation  - 1 x daily - 7 x weekly - 3-5 sets - 15-30 sec hold - Standing Gaze Stabilization with Head Nod  - 1 x daily - 7 x weekly - 3-5 sets - 15-30 sec hold   M-CTSIB  Condition 1: Firm Surface, EO 30 Sec, Normal Sway  Condition 2: Firm Surface, EC 5 Sec, retro_LOB Sway  Condition 3: Foam Surface, EO 14 Sec, Moderate Sway  Condition 4: Foam Surface, EC 8 Sec, Severe Sway      Note: Objective measures were completed at Evaluation unless otherwise noted.  DIAGNOSTIC FINDINGS: 02/25/24 MR angio head: Patent intracranial arterial vasculature. No evidence of occlusion, high-grade stenosis, or aneurysm  12/26/23 brain MRI: Unremarkable  appearance of the brain.  11/05/23 head CT:  No acute intracranial abnormality. 2. Mild thinning of the sigmoid plate on the left with postoperative changes of prior mastoidectomy.  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    COGNITION: Overall cognitive status: Within functional limits for tasks assessed   SENSATION: Pt denies N/T in UEs/LEs   POSTURE:  WNL  GAIT: Gait pattern: reduced B foot clearance  Assistive device utilized: None Level of assistance: Complete Independence  PATIENT SURVEYS:  DHI 30/100   VESTIBULAR ASSESSMENT   GENERAL  OBSERVATION: pt wears readers. Tendency to turn head L with several of today's tests   OCULOMOTOR EXAM: Ocular Alignment: Cover/Uncover Test: WNL Cover/Cross Cover Test: WNL   Ocular ROM: No Limitations   Spontaneous Nystagmus: absent   Gaze-Induced Nystagmus: absent   Smooth Pursuits: intact   Saccades: intact   Convergence/Divergence: 2 cm ;appearing to use R eye only but suppression check was normal   VESTIBULAR - OCULAR REFLEX:    Slow VOR: Normal vertical and horizontal; avoids R head turn. Reports pulsing in L ear   VOR Cancellation: Normal   Head-Impulse Test: HIT Right: positive HIT Left: positive *c/o pulsing in the ears and c/o mild dizziness      AUDITORY SCREEN:  Rinne Test: Positive on L with hearing aid removed     POSITIONAL TESTING:  *c/o pulsating in the ears throughout testing   Right Roll Test: negative Left Roll Test: negative; c/o mild dizziness   Right Sidelying: negative; asymptomatic; c/o Left Sidelying: negative; c/o mild dizziness upon lying down and sitting up                                                                                                                                 TREATMENT DATE: 04/13/24  PATIENT EDUCATION: Education details: prognosis, POC, exam findings, encouraged keeping a log of when sx come on; answered pt's questions on tinnitus and explained that this is not a sx that usually responds to PT Person educated: Patient Education method: Explanation Education comprehension: verbalized understanding     GOALS: Goals reviewed with patient? Yes  SHORT TERM GOALS: Target date: 04/27/2024  Patient to be independent with initial HEP. Baseline: HEP initiated Goal status: INITIAL    LONG TERM GOALS: Target date: 05/11/2024  Patient to be independent with advanced HEP. Baseline: Not yet initiated  Goal status: INITIAL  Patient to report 75% improvement in dizziness.  Baseline: - Goal status: INITIAL  Patient will report 0/10 dizziness with bed mobility.  Baseline: Symptomatic  Goal status: INITIAL  Patient to demonstrate mild sway with M-CTSIB condition with eyes closed/foam surface in order to improve safety in environments with uneven surfaces and dim lighting. Baseline: NT Goal status: INITIAL  Patient to score at least 25/30 on FGA in order to decrease risk of falls. Baseline: 27/30 Goal status: MET  Patient to score at least 18 points less on DHI in order to meet MCID and improve functional outcomes.  Baseline: 30 Goal status: INITIAL   ASSESSMENT:  CLINICAL IMPRESSION: Notes ongoing pulsing/roaring affecting his ear greater at night when settling down.  Review of HEP with good recall/performance and trial of multisensory balance activities on foam with greater unsteadiness and tendency for retro and right LOB under eyes closed conditions.  Notes more symptomatic vertical VOR vs horizontal. Large amplitude movements for habituation via rolling and Brandt-Daroff which are not very symptomatic and mostly provoking to the pulsing/roaring in ear. Walking VOR requiring  slower gait  speed. Continued sessions to progress POC details to improve mobility and reduce symtpoms  From evaluation: Patient is a 58 y/o M presenting to OPPT with c/o dizziness for the past 6+ months. Denies head trauma, infection/illness, vision changes/double vision, new hearing loss, migraines. Also reports pulsatile tinnitus, imbalance. Patient today presenting with positive B HIT, motion sensitivity with L movements, and testing suggesting L sensorineural hearing loss (this is known and pt wears L hearing aid). No central findings today. Would benefit from skilled PT services 1-2x/week for 4 weeks to address aforementioned impairments in order to optimize level of function.    OBJECTIVE IMPAIRMENTS: decreased balance and dizziness.   ACTIVITY LIMITATIONS: carrying, lifting, bending, standing, squatting, stairs, bed mobility, and dressing  PARTICIPATION LIMITATIONS: cleaning, laundry, driving, shopping, community activity, occupation, and yard work  PERSONAL FACTORS: Age, Past/current experiences, Time since onset of injury/illness/exacerbation, and 3+ comorbidities: ADHD, anxiety, depression, GERD, HLD, lumbar surgery 1999, L mastoid surgery are also affecting patient's functional outcome.   REHAB POTENTIAL: Good  CLINICAL DECISION MAKING: Evolving/moderate complexity  EVALUATION COMPLEXITY: Moderate   PLAN:  PT FREQUENCY: 1-2x/week  PT DURATION: 4 weeks  PLANNED INTERVENTIONS: 97164- PT Re-evaluation, 97750- Physical Performance Testing, 97110-Therapeutic exercises, 97530- Therapeutic activity, [id number]- Neuromuscular re-education, 97535- Self Care, 16109- Manual therapy, [id number]- Gait training, [phone]- Canalith repositioning, [phone] (1-2 muscles), 20561 (3+ muscles)- Dry Needling, Patient/Family education, Balance training, Stair training, Taping, Joint mobilization, Spinal mobilization, Vestibular training, Cryotherapy, and Moist heat  PLAN FOR NEXT SESSION: , MSQ,  discuss pt's exercise regimen  to further tease out aggravating factors    4:00 PM, 04/22/24 M. Kelly Liza Czerwinski, PT, DPT Physical Therapist- [place] Office Number: [phone]

## 2024-04-27 ENCOUNTER — Ambulatory Visit: Admitting: Physical Therapy

## 2024-04-29 ENCOUNTER — Ambulatory Visit

## 2024-05-10 ENCOUNTER — Other Ambulatory Visit: Payer: Self-pay | Admitting: Physician Assistant

## 2024-05-12 ENCOUNTER — Telehealth: Payer: Self-pay

## 2024-05-12 NOTE — Telephone Encounter (Signed)
 Attempted to call pt, had to leave a message.  Was scheduled for a new pt appointment 05/13/24 at 3:20p but visit type used was office visit.  Pt needs to be scheduled for a new patient appointment.  In looking at the schedule Dr. Eleanore new pt appointments are booking out to October.  Appointment cancelled and mychart message also sent.

## 2024-05-12 NOTE — Telephone Encounter (Signed)
 See phone note our manager Rosina has tried to get in touch with pt. Will decline until appt is made

## 2024-05-12 NOTE — Telephone Encounter (Signed)
 Last filled by [facility] provider- PCP still listed [facility]

## 2024-05-13 ENCOUNTER — Ambulatory Visit: Admitting: Family Medicine
